# Patient Record
Sex: Female | Born: 1940 | Race: Black or African American | Hispanic: No | Smoking: Former smoker
Health system: Southern US, Community
[De-identification: ages and names within clinical notes are randomized; demographics above are authoritative.]

## PROBLEM LIST (undated history)

## (undated) DIAGNOSIS — I2510 Atherosclerotic heart disease of native coronary artery without angina pectoris: Secondary | ICD-10-CM

## (undated) DIAGNOSIS — R7303 Prediabetes: Secondary | ICD-10-CM

## (undated) DIAGNOSIS — I10 Essential (primary) hypertension: Secondary | ICD-10-CM

## (undated) DIAGNOSIS — E785 Hyperlipidemia, unspecified: Secondary | ICD-10-CM

## (undated) DIAGNOSIS — I429 Cardiomyopathy, unspecified: Secondary | ICD-10-CM

## (undated) HISTORY — DX: Prediabetes: R73.03

## (undated) HISTORY — DX: Essential (primary) hypertension: I10

## (undated) HISTORY — DX: Cardiomyopathy, unspecified: I42.9

## (undated) HISTORY — DX: Hyperlipidemia, unspecified: E78.5

## (undated) HISTORY — DX: Atherosclerotic heart disease of native coronary artery without angina pectoris: I25.10

---

## 2008-01-04 ENCOUNTER — Ambulatory Visit: Payer: Self-pay | Admitting: Internal Medicine

## 2008-01-04 DIAGNOSIS — R252 Cramp and spasm: Secondary | ICD-10-CM | POA: Insufficient documentation

## 2008-01-04 DIAGNOSIS — I10 Essential (primary) hypertension: Secondary | ICD-10-CM | POA: Insufficient documentation

## 2008-01-04 DIAGNOSIS — R002 Palpitations: Secondary | ICD-10-CM

## 2008-01-04 DIAGNOSIS — F411 Generalized anxiety disorder: Secondary | ICD-10-CM | POA: Insufficient documentation

## 2008-01-05 ENCOUNTER — Encounter (INDEPENDENT_AMBULATORY_CARE_PROVIDER_SITE_OTHER): Payer: Self-pay | Admitting: Internal Medicine

## 2008-01-11 ENCOUNTER — Ambulatory Visit (HOSPITAL_COMMUNITY): Admission: RE | Admit: 2008-01-11 | Discharge: 2008-01-12 | Payer: Self-pay | Admitting: Internal Medicine

## 2008-01-11 ENCOUNTER — Ambulatory Visit: Payer: Self-pay | Admitting: Cardiology

## 2008-01-11 ENCOUNTER — Encounter (INDEPENDENT_AMBULATORY_CARE_PROVIDER_SITE_OTHER): Payer: Self-pay | Admitting: Internal Medicine

## 2008-01-12 ENCOUNTER — Encounter (INDEPENDENT_AMBULATORY_CARE_PROVIDER_SITE_OTHER): Payer: Self-pay | Admitting: Internal Medicine

## 2008-01-15 ENCOUNTER — Ambulatory Visit: Payer: Self-pay | Admitting: Cardiology

## 2008-02-04 LAB — CONVERTED CEMR LAB
ALT: 23 units/L (ref 0–35)
Alkaline Phosphatase: 86 units/L (ref 39–117)
Basophils Absolute: 0 10*3/uL (ref 0.0–0.1)
Creatinine, Ser: 0.8 mg/dL (ref 0.40–1.20)
Eosinophils Absolute: 0.1 10*3/uL (ref 0.0–0.7)
Eosinophils Relative: 2 % (ref 0–5)
HCT: 44 % (ref 36.0–46.0)
LDL Cholesterol: 130 mg/dL — ABNORMAL HIGH (ref 0–99)
MCHC: 32.7 g/dL (ref 30.0–36.0)
MCV: 87 fL (ref 78.0–100.0)
Monocytes Absolute: 0.5 10*3/uL (ref 0.1–1.0)
Platelets: 293 10*3/uL (ref 150–400)
RDW: 12.9 % (ref 11.5–15.5)
Sodium: 142 meq/L (ref 135–145)
Total Bilirubin: 0.4 mg/dL (ref 0.3–1.2)
Total CHOL/HDL Ratio: 5.8
Total Protein: 7.9 g/dL (ref 6.0–8.3)
Triglycerides: 236 mg/dL — ABNORMAL HIGH (ref <150)
VLDL: 47 mg/dL — ABNORMAL HIGH (ref 0–40)

## 2008-02-10 ENCOUNTER — Ambulatory Visit: Payer: Self-pay | Admitting: Internal Medicine

## 2008-03-26 ENCOUNTER — Encounter (INDEPENDENT_AMBULATORY_CARE_PROVIDER_SITE_OTHER): Payer: Self-pay | Admitting: Internal Medicine

## 2008-07-13 ENCOUNTER — Ambulatory Visit: Payer: Self-pay | Admitting: Internal Medicine

## 2008-07-13 DIAGNOSIS — F17200 Nicotine dependence, unspecified, uncomplicated: Secondary | ICD-10-CM

## 2008-07-13 DIAGNOSIS — E785 Hyperlipidemia, unspecified: Secondary | ICD-10-CM

## 2008-07-13 LAB — CONVERTED CEMR LAB
Bilirubin Urine: NEGATIVE
Glucose, Urine, Semiquant: NEGATIVE
Protein, U semiquant: NEGATIVE
Urobilinogen, UA: 0.2
WBC Urine, dipstick: NEGATIVE

## 2008-07-14 ENCOUNTER — Encounter (INDEPENDENT_AMBULATORY_CARE_PROVIDER_SITE_OTHER): Payer: Self-pay | Admitting: Internal Medicine

## 2008-07-15 LAB — CONVERTED CEMR LAB
Alkaline Phosphatase: 77 units/L (ref 39–117)
CO2: 28 meq/L (ref 19–32)
Cholesterol: 185 mg/dL (ref 0–200)
Creatinine, Ser: 0.96 mg/dL (ref 0.40–1.20)
Glucose, Bld: 172 mg/dL — ABNORMAL HIGH (ref 70–99)
HDL: 43 mg/dL (ref >39)
LDL Cholesterol: 104 mg/dL — ABNORMAL HIGH (ref 0–99)
Sodium: 139 meq/L (ref 135–145)
Total Bilirubin: 0.4 mg/dL (ref 0.3–1.2)
Total CHOL/HDL Ratio: 4.3
Total Protein: 7.8 g/dL (ref 6.0–8.3)
Triglycerides: 188 mg/dL — ABNORMAL HIGH (ref <150)
VLDL: 38 mg/dL (ref 0–40)

## 2008-07-19 ENCOUNTER — Encounter (INDEPENDENT_AMBULATORY_CARE_PROVIDER_SITE_OTHER): Payer: Self-pay | Admitting: Internal Medicine

## 2008-08-10 ENCOUNTER — Ambulatory Visit: Payer: Self-pay | Admitting: Internal Medicine

## 2008-08-10 DIAGNOSIS — E119 Type 2 diabetes mellitus without complications: Secondary | ICD-10-CM | POA: Insufficient documentation

## 2008-08-25 ENCOUNTER — Encounter (INDEPENDENT_AMBULATORY_CARE_PROVIDER_SITE_OTHER): Payer: Self-pay | Admitting: Internal Medicine

## 2008-09-13 ENCOUNTER — Ambulatory Visit: Payer: Self-pay | Admitting: Internal Medicine

## 2008-10-07 ENCOUNTER — Ambulatory Visit: Payer: Self-pay | Admitting: Internal Medicine

## 2008-10-07 DIAGNOSIS — R109 Unspecified abdominal pain: Secondary | ICD-10-CM | POA: Insufficient documentation

## 2008-10-07 LAB — CONVERTED CEMR LAB
Bilirubin Urine: NEGATIVE
Blood in Urine, dipstick: NEGATIVE
Nitrite: NEGATIVE
Protein, U semiquant: NEGATIVE
Specific Gravity, Urine: 1.015
WBC Urine, dipstick: NEGATIVE

## 2010-02-19 ENCOUNTER — Encounter: Payer: Self-pay | Admitting: Family Medicine

## 2012-04-15 ENCOUNTER — Other Ambulatory Visit (HOSPITAL_COMMUNITY): Payer: Self-pay | Admitting: Family Medicine

## 2012-04-20 ENCOUNTER — Ambulatory Visit (HOSPITAL_COMMUNITY)
Admission: RE | Admit: 2012-04-20 | Discharge: 2012-04-20 | Disposition: A | Discharge status: Home / Self Care | Payer: PRIVATE HEALTH INSURANCE | Source: Ambulatory Visit | Attending: Family Medicine | Admitting: Family Medicine

## 2012-04-20 DIAGNOSIS — Z1231 Encounter for screening mammogram for malignant neoplasm of breast: Secondary | ICD-10-CM | POA: Insufficient documentation

## 2012-04-20 DIAGNOSIS — Z139 Encounter for screening, unspecified: Secondary | ICD-10-CM

## 2015-05-10 ENCOUNTER — Encounter: Payer: Self-pay | Admitting: Podiatry

## 2015-05-10 ENCOUNTER — Ambulatory Visit (INDEPENDENT_AMBULATORY_CARE_PROVIDER_SITE_OTHER): Payer: Medicare Other

## 2015-05-10 ENCOUNTER — Ambulatory Visit (INDEPENDENT_AMBULATORY_CARE_PROVIDER_SITE_OTHER): Payer: Medicare Other | Admitting: Podiatry

## 2015-05-10 ENCOUNTER — Ambulatory Visit: Payer: Self-pay

## 2015-05-10 VITALS — BP 167/99 | HR 80 | Resp 16 | Ht 67.0 in | Wt 162.0 lb

## 2015-05-10 DIAGNOSIS — M779 Enthesopathy, unspecified: Secondary | ICD-10-CM | POA: Diagnosis not present

## 2015-05-10 DIAGNOSIS — M21619 Bunion of unspecified foot: Secondary | ICD-10-CM

## 2015-05-10 DIAGNOSIS — M79672 Pain in left foot: Secondary | ICD-10-CM | POA: Diagnosis not present

## 2015-05-10 DIAGNOSIS — M79671 Pain in right foot: Secondary | ICD-10-CM

## 2015-05-10 DIAGNOSIS — M216X9 Other acquired deformities of unspecified foot: Secondary | ICD-10-CM

## 2015-05-10 MED ORDER — TRIAMCINOLONE ACETONIDE 10 MG/ML IJ SUSP
10.0000 mg | Freq: Once | INTRAMUSCULAR | Status: AC
  Administered 2015-05-10: 10 mg

## 2015-05-10 NOTE — Progress Notes (Signed)
   Subjective:    Patient ID: Taylor Long, female    DOB: 12/09/1940, 75 y.o.   MRN: [id number]  HPI Patient presents with foot pain in their Left foot; dorsal near ankle; pt stated, "When lift up foot, hears and feels popping sound"; x2 yrs  Patient also presents with bilateral callouses; plantar forefoot-below 2nd toe; Corn-Left-5th toe-lateral   Review of Systems  Cardiovascular: Positive for palpitations.  All other systems reviewed and are negative.      Objective:   Physical Exam        Assessment & Plan:

## 2015-05-10 NOTE — Progress Notes (Signed)
Subjective:     Patient ID: Taylor Long, female   DOB: 04/02/1940, 75 y.o.   MRN: [id number]  HPI patient presents with structural bunion deformity left that she states doesn't hurt a feeling of a popping sensation in her left foot that does not hurt and pain in the outside of the left foot. Also complains of pain underneath the right foot with corn formation that she tries to trim herself   Review of Systems  All other systems reviewed and are negative.      Objective:   Physical Exam  Constitutional: She is oriented to person, place, and time.  Cardiovascular: Intact distal pulses.   Musculoskeletal: Normal range of motion.  Neurological: She is oriented to person, place, and time.  Skin: Skin is warm and dry.  Nursing note and vitals reviewed.  neurovascular status intact muscle strength was adequate range of motion was diminished but within normal limits. Large structural bunion deformity left nonsymptomatic with quite a bit of pain in the outside the left foot around the peroneal tendon. Patient is noted to have significant keratotic lesion second digit right metatarsal to that is painful when pressed and does not have any muscle loss of the peroneal tendon currently good digital perfusion is noted     Assessment:     Probable plantarflexed second metatarsal with keratotic lesion chronic in nature along with tendinitis left peroneal tendon with possibility of sheath tear even though no dysfunction noted with structural bunion deformity left nonsymptomatic    Plan:     H&P and all conditions reviewed with patient at great length. Patient was not happy with my care despite my best efforts and I did debride the lesion right and applied padding and did careful sheath injection left which she states made her feet feel better. I explained at great length to her that I do think at one point osteotomy of the right may be necessary and possible MRI left and also I do not recommend  structural bunion deformity since it does not hurt. She seemed unhappy with my care and I did offer her other physicians to see and she will make a decision and call us back. I encouraged her to call with any questions  Indicated structural bunion deformity left no indications of advanced arthritis or fracture

## 2015-06-30 ENCOUNTER — Other Ambulatory Visit (HOSPITAL_COMMUNITY): Payer: Self-pay | Admitting: Family Medicine

## 2015-06-30 DIAGNOSIS — Z1231 Encounter for screening mammogram for malignant neoplasm of breast: Secondary | ICD-10-CM

## 2015-07-06 ENCOUNTER — Ambulatory Visit (HOSPITAL_COMMUNITY)
Admission: RE | Admit: 2015-07-06 | Discharge: 2015-07-06 | Disposition: A | Discharge status: Home / Self Care | Payer: Medicare Other | Source: Ambulatory Visit | Attending: Family Medicine | Admitting: Family Medicine

## 2015-07-06 DIAGNOSIS — Z1231 Encounter for screening mammogram for malignant neoplasm of breast: Secondary | ICD-10-CM | POA: Insufficient documentation

## 2016-03-18 ENCOUNTER — Emergency Department (HOSPITAL_COMMUNITY)
Admission: EM | Admit: 2016-03-18 | Discharge: 2016-03-18 | Disposition: A | Discharge status: Home / Self Care | Payer: Medicare Other | Attending: Emergency Medicine | Admitting: Emergency Medicine

## 2016-03-18 ENCOUNTER — Encounter (HOSPITAL_COMMUNITY): Payer: Self-pay | Admitting: *Deleted

## 2016-03-18 ENCOUNTER — Emergency Department (HOSPITAL_COMMUNITY): Payer: Medicare Other

## 2016-03-18 DIAGNOSIS — Z79899 Other long term (current) drug therapy: Secondary | ICD-10-CM | POA: Diagnosis not present

## 2016-03-18 DIAGNOSIS — I160 Hypertensive urgency: Secondary | ICD-10-CM | POA: Diagnosis not present

## 2016-03-18 DIAGNOSIS — E876 Hypokalemia: Secondary | ICD-10-CM | POA: Insufficient documentation

## 2016-03-18 DIAGNOSIS — E119 Type 2 diabetes mellitus without complications: Secondary | ICD-10-CM | POA: Diagnosis not present

## 2016-03-18 DIAGNOSIS — Z87891 Personal history of nicotine dependence: Secondary | ICD-10-CM | POA: Insufficient documentation

## 2016-03-18 DIAGNOSIS — Z791 Long term (current) use of non-steroidal anti-inflammatories (NSAID): Secondary | ICD-10-CM | POA: Insufficient documentation

## 2016-03-18 DIAGNOSIS — I10 Essential (primary) hypertension: Secondary | ICD-10-CM

## 2016-03-18 DIAGNOSIS — R51 Headache: Secondary | ICD-10-CM | POA: Diagnosis present

## 2016-03-18 LAB — COMPREHENSIVE METABOLIC PANEL
ALT: 21 U/L (ref 14–54)
AST: 30 U/L (ref 15–41)
Albumin: 4.8 g/dL (ref 3.5–5.0)
Alkaline Phosphatase: 89 U/L (ref 38–126)
Anion gap: 13 (ref 5–15)
BUN: 22 mg/dL — ABNORMAL HIGH (ref 6–20)
CO2: 35 mmol/L — ABNORMAL HIGH (ref 22–32)
Calcium: 10.1 mg/dL (ref 8.9–10.3)
Chloride: 86 mmol/L — ABNORMAL LOW (ref 101–111)
Creatinine, Ser: 1.19 mg/dL — ABNORMAL HIGH (ref 0.44–1.00)
GFR calc Af Amer: 51 mL/min — ABNORMAL LOW (ref >60)
GFR calc non Af Amer: 44 mL/min — ABNORMAL LOW (ref >60)
Glucose, Bld: 183 mg/dL — ABNORMAL HIGH (ref 65–99)
Potassium: 2.2 mmol/L — CL (ref 3.5–5.1)
Sodium: 134 mmol/L — ABNORMAL LOW (ref 135–145)
Total Bilirubin: 1 mg/dL (ref 0.3–1.2)
Total Protein: 9.1 g/dL — ABNORMAL HIGH (ref 6.5–8.1)

## 2016-03-18 LAB — CBC WITH DIFFERENTIAL/PLATELET
Basophils Absolute: 0 10*3/uL (ref 0.0–0.1)
Basophils Relative: 0 %
Eosinophils Absolute: 0 10*3/uL (ref 0.0–0.7)
Eosinophils Relative: 0 %
HCT: 42.2 % (ref 36.0–46.0)
Hemoglobin: 14.1 g/dL (ref 12.0–15.0)
Lymphocytes Relative: 10 %
Lymphs Abs: 1.1 10*3/uL (ref 0.7–4.0)
MCH: 26.9 pg (ref 26.0–34.0)
MCHC: 33.4 g/dL (ref 30.0–36.0)
MCV: 80.5 fL (ref 78.0–100.0)
Monocytes Absolute: 0.6 10*3/uL (ref 0.1–1.0)
Monocytes Relative: 5 %
Neutro Abs: 9.7 10*3/uL — ABNORMAL HIGH (ref 1.7–7.7)
Neutrophils Relative %: 85 %
Platelets: 290 10*3/uL (ref 150–400)
RBC: 5.24 MIL/uL — ABNORMAL HIGH (ref 3.87–5.11)
RDW: 12.6 % (ref 11.5–15.5)
WBC: 11.4 10*3/uL — ABNORMAL HIGH (ref 4.0–10.5)

## 2016-03-18 LAB — TROPONIN I
Troponin I: 0.05 ng/mL (ref <0.03)
Troponin I: 0.05 ng/mL (ref <0.03)

## 2016-03-18 LAB — BRAIN NATRIURETIC PEPTIDE: B Natriuretic Peptide: 307 pg/mL — ABNORMAL HIGH (ref 0.0–100.0)

## 2016-03-18 LAB — MAGNESIUM: Magnesium: 2.3 mg/dL (ref 1.7–2.4)

## 2016-03-18 MED ORDER — DIPHENHYDRAMINE HCL 50 MG/ML IJ SOLN
25.0000 mg | Freq: Once | INTRAMUSCULAR | Status: AC
  Administered 2016-03-18: 25 mg via INTRAVENOUS
  Filled 2016-03-18: qty 1

## 2016-03-18 MED ORDER — POTASSIUM CHLORIDE 10 MEQ/100ML IV SOLN
10.0000 meq | INTRAVENOUS | Status: AC
Start: 2016-03-18 — End: 2016-03-18
  Administered 2016-03-18 (×2): 10 meq via INTRAVENOUS
  Filled 2016-03-18 (×2): qty 100

## 2016-03-18 MED ORDER — LABETALOL HCL 5 MG/ML IV SOLN
20.0000 mg | Freq: Once | INTRAVENOUS | Status: AC
  Administered 2016-03-18: 20 mg via INTRAVENOUS
  Filled 2016-03-18: qty 4

## 2016-03-18 MED ORDER — POTASSIUM CHLORIDE 10 MEQ/100ML IV SOLN
10.0000 meq | Freq: Once | INTRAVENOUS | Status: AC
  Administered 2016-03-18: 10 meq via INTRAVENOUS
  Filled 2016-03-18: qty 100

## 2016-03-18 MED ORDER — ONDANSETRON HCL 4 MG/2ML IJ SOLN
4.0000 mg | Freq: Once | INTRAMUSCULAR | Status: AC
  Administered 2016-03-18: 4 mg via INTRAVENOUS
  Filled 2016-03-18: qty 2

## 2016-03-18 MED ORDER — ONDANSETRON HCL 4 MG PO TABS
4.0000 mg | ORAL_TABLET | Freq: Three times a day (TID) | ORAL | 0 refills | Status: DC | PRN
Start: 2016-03-18 — End: 2016-04-24

## 2016-03-18 MED ORDER — POTASSIUM CHLORIDE CRYS ER 20 MEQ PO TBCR
20.0000 meq | EXTENDED_RELEASE_TABLET | Freq: Once | ORAL | Status: AC
Start: 2016-03-18 — End: 2016-03-18
  Administered 2016-03-18: 20 meq via ORAL
  Filled 2016-03-18: qty 1

## 2016-03-18 MED ORDER — POTASSIUM CHLORIDE CRYS ER 20 MEQ PO TBCR
60.0000 meq | EXTENDED_RELEASE_TABLET | Freq: Once | ORAL | Status: AC
  Administered 2016-03-18: 60 meq via ORAL
  Filled 2016-03-18: qty 3

## 2016-03-18 MED ORDER — METOPROLOL TARTRATE 50 MG PO TABS: 50.0000 mg | ORAL_TABLET | Freq: Two times a day (BID) | ORAL | 0 refills | Status: DC

## 2016-03-18 MED ORDER — SODIUM CHLORIDE 0.9 % IV SOLN
INTRAVENOUS | Status: DC
  Administered 2016-03-18: 12:00:00 via INTRAVENOUS

## 2016-03-18 MED ORDER — PROCHLORPERAZINE EDISYLATE 5 MG/ML IJ SOLN
10.0000 mg | Freq: Once | INTRAMUSCULAR | Status: AC
  Administered 2016-03-18: 10 mg via INTRAVENOUS
  Filled 2016-03-18: qty 2

## 2016-03-18 NOTE — ED Triage Notes (Signed)
Pt says PCP (Dr. Sudie BaileyKnowlton) added 25 mg Metoprolol to her HTN medication Hydrochlorothiazide 25mg . Pt says her BP has been elevated ever since.

## 2016-03-18 NOTE — ED Notes (Signed)
Patient states nausea has gone. Feels much better but sleepy. BP 165/105

## 2016-03-18 NOTE — ED Notes (Signed)
Patient reports nausea subsided and she feels sleepy but much better. BP still 200 systolic. Dr. Juleen ChinaKohut aware. Med orders given,.

## 2016-03-18 NOTE — ED Provider Notes (Signed)
[facility]EM[facility]GENCY DEPT Provider Note   CSN: [id number] Arrival date & time: 03/18/16  [id number]   By signing my name below, I, Taylor Long, attest that this documentation has been prepared under the direction and in the presence of Raeford RazorStephen Karl Knarr, MD . Electronically Signed: Freida Busmaniana Long, Scribe. 03/18/2016. 11:19 AM.  History   Chief Complaint Chief Complaint  Patient presents with  . Hypertension    The history is provided by the patient. No language interpreter was used.     HPI Comments:  Taylor Long is a 76 y.o. female with a history of HTN, who presents to the Emergency Department complaining of daily headaches x a few weeks. She reports pain to the left sided of her head. Pt reports associated nausea and vomiting. She notes recent change in BP meds;  PCP added 25 mg Metoprolol to her Hydrochlorothiazide 25mg  on 2/92/2018. Her blood pressures at home have been in the 200s systolically. She denies numbness/tingling/weakness, leg swelling, urinary symptoms/ changes in urination. No alleviating factors noted.   Past Medical History:  Diagnosis Date  . Hypertension     Patient Active Problem List   Diagnosis Date Noted  . PELVIC  PAIN 10/07/2008  . DIABETES MELLITUS, TYPE II 08/10/2008  . HYPERLIPIDEMIA 07/13/2008  . TOBACCO ABUSE 07/13/2008  . ANXIETY 01/04/2008  . HYPERTENSION 01/04/2008  . LEG CRAMPS 01/04/2008  . PALPITATIONS 01/04/2008    History reviewed. No pertinent surgical history.  OB History    Gravida Para Term Preterm AB Living             4   SAB TAB Ectopic Multiple Live Births                   Home Medications    Prior to Admission medications   Medication Sig Start Date End Date Taking? Authorizing Provider  hydrochlorothiazide (HYDRODIURIL) 25 MG tablet  05/01/15   Historical Provider, MD    Family History No family history on file.  Social History Social History  Substance Use Topics  . Smoking status: Former Games developermoker  . Smokeless  tobacco: Never Used  . Alcohol use No     Allergies   Latex   Review of Systems Review of Systems  Cardiovascular: Negative for leg swelling.  Gastrointestinal: Positive for nausea and vomiting.  Genitourinary: Negative for dysuria, frequency and hematuria.  Neurological: Positive for headaches. Negative for weakness and numbness.  All other systems reviewed and are negative.    Physical Exam Updated Vital Signs BP (!) 237/127   Pulse 86   Temp 97.9 F (36.6 C) (Oral)   Resp 22   Ht 5\' 6"  (1.676 m)   Wt 153 lb (69.4 kg)   SpO2 97%   BMI 24.69 kg/m   Physical Exam  Constitutional: She is oriented to person, place, and time. She appears well-developed and well-nourished. No distress.  HENT:  Head: Normocephalic and atraumatic.  Eyes: EOM are normal.  Neck: Normal range of motion.  Cardiovascular: Normal rate, regular rhythm and normal heart sounds.   Pulmonary/Chest: Effort normal and breath sounds normal.  Abdominal: Soft. She exhibits no distension. There is no tenderness.  Musculoskeletal: Normal range of motion.  Neurological: She is alert and oriented to person, place, and time.  Skin: Skin is warm and dry.  Psychiatric: She has a normal mood and affect.  Nursing note and vitals reviewed.    ED Treatments / Results  DIAGNOSTIC STUDIES:  Oxygen Saturation is 97% on RA,  normal by my interpretation.    COORDINATION OF CARE:  10:51 AM Discussed treatment plan with pt at bedside and pt agreed to plan.  Labs (all labs ordered are listed, but only abnormal results are displayed) Labs Reviewed  CBC WITH DIFFERENTIAL/PLATELET  COMPREHENSIVE METABOLIC PANEL  TROPONIN I    EKG  EKG Interpretation None       Radiology No results found.  Procedures Procedures (including critical care time)  Medications Ordered in ED Medications - No data to display   Initial Impression / Assessment and Plan / ED Course  I have reviewed the triage vital signs  and the nursing notes.  Pertinent labs & imaging results that were available during my care of the patient were reviewed by me and considered in my medical decision making (see chart for details).      Final Clinical Impressions(s) / ED Diagnoses   Final diagnoses:  Essential hypertension  Hypokalemia  Hypertensive urgency    New Prescriptions New Prescriptions   No medications on file   I personally preformed the services scribed in my presence. The recorded information has been reviewed is accurate. Raeford Razor, MD.     Raeford Razor, MD 03/24/16 2113

## 2016-04-05 ENCOUNTER — Inpatient Hospital Stay (HOSPITAL_COMMUNITY)
Admission: EM | Admit: 2016-04-05 | Discharge: 2016-04-19 | DRG: 269 | Disposition: A | Discharge status: Home Health | Payer: Medicare Other | Attending: Vascular Surgery | Admitting: Vascular Surgery

## 2016-04-05 ENCOUNTER — Emergency Department (HOSPITAL_COMMUNITY): Payer: Medicare Other

## 2016-04-05 ENCOUNTER — Other Ambulatory Visit (HOSPITAL_COMMUNITY): Payer: Self-pay | Admitting: Family Medicine

## 2016-04-05 ENCOUNTER — Encounter (HOSPITAL_COMMUNITY): Payer: Self-pay | Admitting: Emergency Medicine

## 2016-04-05 ENCOUNTER — Ambulatory Visit (HOSPITAL_COMMUNITY)
Admission: RE | Admit: 2016-04-05 | Discharge: 2016-04-05 | Disposition: A | Discharge status: Home / Self Care | Payer: Medicare Other | Source: Ambulatory Visit | Attending: Family Medicine | Admitting: Family Medicine

## 2016-04-05 DIAGNOSIS — I714 Abdominal aortic aneurysm, without rupture, unspecified: Secondary | ICD-10-CM

## 2016-04-05 DIAGNOSIS — Z01810 Encounter for preprocedural cardiovascular examination: Secondary | ICD-10-CM | POA: Diagnosis not present

## 2016-04-05 DIAGNOSIS — Z79899 Other long term (current) drug therapy: Secondary | ICD-10-CM

## 2016-04-05 DIAGNOSIS — I130 Hypertensive heart and chronic kidney disease with heart failure and stage 1 through stage 4 chronic kidney disease, or unspecified chronic kidney disease: Secondary | ICD-10-CM | POA: Diagnosis present

## 2016-04-05 DIAGNOSIS — N280 Ischemia and infarction of kidney: Secondary | ICD-10-CM

## 2016-04-05 DIAGNOSIS — R0602 Shortness of breath: Secondary | ICD-10-CM

## 2016-04-05 DIAGNOSIS — K660 Peritoneal adhesions (postprocedural) (postinfection): Secondary | ICD-10-CM | POA: Diagnosis not present

## 2016-04-05 DIAGNOSIS — R197 Diarrhea, unspecified: Secondary | ICD-10-CM | POA: Diagnosis not present

## 2016-04-05 DIAGNOSIS — F419 Anxiety disorder, unspecified: Secondary | ICD-10-CM | POA: Diagnosis present

## 2016-04-05 DIAGNOSIS — R002 Palpitations: Secondary | ICD-10-CM | POA: Diagnosis not present

## 2016-04-05 DIAGNOSIS — I2584 Coronary atherosclerosis due to calcified coronary lesion: Secondary | ICD-10-CM | POA: Diagnosis present

## 2016-04-05 DIAGNOSIS — E876 Hypokalemia: Secondary | ICD-10-CM | POA: Diagnosis not present

## 2016-04-05 DIAGNOSIS — R1900 Intra-abdominal and pelvic swelling, mass and lump, unspecified site: Secondary | ICD-10-CM | POA: Diagnosis present

## 2016-04-05 DIAGNOSIS — I2510 Atherosclerotic heart disease of native coronary artery without angina pectoris: Secondary | ICD-10-CM | POA: Diagnosis not present

## 2016-04-05 DIAGNOSIS — R1010 Upper abdominal pain, unspecified: Secondary | ICD-10-CM

## 2016-04-05 DIAGNOSIS — N183 Chronic kidney disease, stage 3 unspecified: Secondary | ICD-10-CM

## 2016-04-05 DIAGNOSIS — I25119 Atherosclerotic heart disease of native coronary artery with unspecified angina pectoris: Secondary | ICD-10-CM | POA: Diagnosis present

## 2016-04-05 DIAGNOSIS — Z8679 Personal history of other diseases of the circulatory system: Secondary | ICD-10-CM | POA: Insufficient documentation

## 2016-04-05 DIAGNOSIS — I25118 Atherosclerotic heart disease of native coronary artery with other forms of angina pectoris: Secondary | ICD-10-CM | POA: Diagnosis not present

## 2016-04-05 DIAGNOSIS — Z9889 Other specified postprocedural states: Secondary | ICD-10-CM

## 2016-04-05 DIAGNOSIS — I208 Other forms of angina pectoris: Secondary | ICD-10-CM | POA: Diagnosis not present

## 2016-04-05 DIAGNOSIS — K8020 Calculus of gallbladder without cholecystitis without obstruction: Secondary | ICD-10-CM

## 2016-04-05 DIAGNOSIS — I161 Hypertensive emergency: Secondary | ICD-10-CM | POA: Diagnosis not present

## 2016-04-05 DIAGNOSIS — E1122 Type 2 diabetes mellitus with diabetic chronic kidney disease: Secondary | ICD-10-CM | POA: Diagnosis present

## 2016-04-05 DIAGNOSIS — I509 Heart failure, unspecified: Secondary | ICD-10-CM | POA: Diagnosis present

## 2016-04-05 DIAGNOSIS — Z87891 Personal history of nicotine dependence: Secondary | ICD-10-CM

## 2016-04-05 DIAGNOSIS — I959 Hypotension, unspecified: Secondary | ICD-10-CM | POA: Diagnosis present

## 2016-04-05 DIAGNOSIS — E785 Hyperlipidemia, unspecified: Secondary | ICD-10-CM | POA: Diagnosis present

## 2016-04-05 DIAGNOSIS — I110 Hypertensive heart disease with heart failure: Secondary | ICD-10-CM | POA: Diagnosis not present

## 2016-04-05 DIAGNOSIS — I10 Essential (primary) hypertension: Secondary | ICD-10-CM | POA: Diagnosis not present

## 2016-04-05 LAB — CBC WITH DIFFERENTIAL/PLATELET
BASOS ABS: 0 10*3/uL (ref 0.0–0.1)
BASOS PCT: 0 %
EOS ABS: 0.1 10*3/uL (ref 0.0–0.7)
EOS PCT: 1 %
HCT: 40.9 % (ref 36.0–46.0)
Hemoglobin: 13.7 g/dL (ref 12.0–15.0)
Lymphocytes Relative: 29 %
Lymphs Abs: 2.5 10*3/uL (ref 0.7–4.0)
MCH: 27.5 pg (ref 26.0–34.0)
MCHC: 33.5 g/dL (ref 30.0–36.0)
MCV: 82 fL (ref 78.0–100.0)
MONO ABS: 0.6 10*3/uL (ref 0.1–1.0)
Monocytes Relative: 7 %
Neutro Abs: 5.2 10*3/uL (ref 1.7–7.7)
Neutrophils Relative %: 63 %
PLATELETS: 343 10*3/uL (ref 150–400)
RBC: 4.99 MIL/uL (ref 3.87–5.11)
RDW: 13.1 % (ref 11.5–15.5)
WBC: 8.4 10*3/uL (ref 4.0–10.5)

## 2016-04-05 LAB — COMPREHENSIVE METABOLIC PANEL
ALT: 17 U/L (ref 14–54)
AST: 22 U/L (ref 15–41)
Albumin: 4.5 g/dL (ref 3.5–5.0)
Alkaline Phosphatase: 79 U/L (ref 38–126)
Anion gap: 10 (ref 5–15)
BILIRUBIN TOTAL: 0.6 mg/dL (ref 0.3–1.2)
BUN: 18 mg/dL (ref 6–20)
CALCIUM: 10.1 mg/dL (ref 8.9–10.3)
CO2: 32 mmol/L (ref 22–32)
CREATININE: 0.97 mg/dL (ref 0.44–1.00)
Chloride: 94 mmol/L — ABNORMAL LOW (ref 101–111)
GFR, EST NON AFRICAN AMERICAN: 57 mL/min — AB (ref >60)
Glucose, Bld: 108 mg/dL — ABNORMAL HIGH (ref 65–99)
Potassium: 3.1 mmol/L — ABNORMAL LOW (ref 3.5–5.1)
Sodium: 136 mmol/L (ref 135–145)
TOTAL PROTEIN: 8.8 g/dL — AB (ref 6.5–8.1)

## 2016-04-05 LAB — LIPASE, BLOOD: Lipase: 18 U/L (ref 11–51)

## 2016-04-05 LAB — SAMPLE TO BLOOD BANK

## 2016-04-05 MED ORDER — LOSARTAN POTASSIUM 50 MG PO TABS
50.0000 mg | ORAL_TABLET | Freq: Every day | ORAL | Status: DC
  Administered 2016-04-06 – 2016-04-08 (×3): 50 mg via ORAL
  Filled 2016-04-05 (×3): qty 1

## 2016-04-05 MED ORDER — IBUPROFEN 200 MG PO TABS
200.0000 mg | ORAL_TABLET | Freq: Four times a day (QID) | ORAL | Status: DC | PRN
  Administered 2016-04-06: 200 mg via ORAL
  Filled 2016-04-05: qty 1

## 2016-04-05 MED ORDER — METOPROLOL SUCCINATE ER 25 MG PO TB24
25.0000 mg | ORAL_TABLET | Freq: Every day | ORAL | Status: DC
  Administered 2016-04-06: 25 mg via ORAL
  Filled 2016-04-05: qty 1

## 2016-04-05 MED ORDER — HYDROCHLOROTHIAZIDE 25 MG PO TABS
25.0000 mg | ORAL_TABLET | Freq: Every day | ORAL | Status: DC
  Administered 2016-04-06: 25 mg via ORAL
  Filled 2016-04-05: qty 1

## 2016-04-05 MED ORDER — LABETALOL HCL 5 MG/ML IV SOLN
20.0000 mg | Freq: Once | INTRAVENOUS | Status: AC
  Administered 2016-04-05: 20 mg via INTRAVENOUS
  Filled 2016-04-05: qty 4

## 2016-04-05 MED ORDER — ONDANSETRON HCL 4 MG PO TABS: 4.0000 mg | ORAL_TABLET | Freq: Three times a day (TID) | ORAL | Status: DC | PRN

## 2016-04-05 MED ORDER — SODIUM CHLORIDE 0.9 % IV SOLN
INTRAVENOUS | Status: DC
  Administered 2016-04-05 – 2016-04-11 (×3): via INTRAVENOUS

## 2016-04-05 MED ORDER — LORAZEPAM 0.5 MG PO TABS
0.2500 mg | ORAL_TABLET | Freq: Two times a day (BID) | ORAL | Status: DC | PRN
  Administered 2016-04-06: 0.25 mg via ORAL
  Administered 2016-04-07: 0.5 mg via ORAL
  Filled 2016-04-05 (×2): qty 1

## 2016-04-05 MED ORDER — LABETALOL HCL 5 MG/ML IV SOLN
10.0000 mg | INTRAVENOUS | Status: DC | PRN
  Administered 2016-04-05 – 2016-04-06 (×3): 10 mg via INTRAVENOUS
  Filled 2016-04-05 (×3): qty 4

## 2016-04-05 MED ORDER — METOPROLOL SUCCINATE ER 50 MG PO TB24: 50.0000 mg | ORAL_TABLET | Freq: Every day | ORAL | Status: DC

## 2016-04-05 MED ORDER — IOPAMIDOL (ISOVUE-370) INJECTION 76%
100.0000 mL | Freq: Once | INTRAVENOUS | Status: AC | PRN
  Administered 2016-04-05: 100 mL via INTRAVENOUS

## 2016-04-05 NOTE — ED Provider Notes (Signed)
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 Evaluated on arrival, VSS, AAOx3. Consult to Vascular Surgery, will admit for elective repair.   Discussed with my attending physician, Dr Clydene PughKnott.    Pablo LedgerElizabeth Mitchell Eaton Folmar, MD 04/06/16 0009    Lyndal Pulleyaniel Knott, MD 04/06/16 [phone]

## 2016-04-05 NOTE — H&P (Signed)
Patient name: Taylor Long MRN: [id number] DOB: 12/13/1940 Sex: female  REASON FOR ADMISSION: 8 cm abdominal aortic aneurysm  HPI: Taylor Long is a 76 y.o. female, who tells me that she was seen by her primary care physician today for a routine follow up visit and was found to have a pulsatile abdominal mass. She was sent to the emergency department and CT scan showed an 8 cm aneurysm. She was transferred here for vascular evaluation.  She denies abdominal pain or back pain. She does not describe any abdominal pain or back pain over the last year. There is no family history of aneurysmal disease in the family.  Her only significant medical history is hypertension which appears to be poorly controlled. She denies any history of diabetes, hypercholesterolemia, history of previous myocardial infarction, history of congestive heart failure or history of COPD.  Past Medical History:  Diagnosis Date  . Hypertension     History reviewed. No pertinent family history. There is no family history of premature cardiovascular disease or aneurysmal disease.  SOCIAL HISTORY: She quit smoking in December 2017. She smoked less than 1 pack per day. Social History   Social History  . Marital status: Widowed    Spouse name: N/A  . Number of children: N/A  . Years of education: N/A   Occupational History  . Not on file.   Social History Main Topics  . Smoking status: Former Games developermoker  . Smokeless tobacco: Never Used  . Alcohol use No  . Drug use: No  . Sexual activity: Not on file   Other Topics Concern  . Not on file   Social History Narrative  . No narrative on file    Allergies  Allergen Reactions  . Latex Other (See Comments)    Break out in a rash    Current Facility-Administered Medications  Medication Dose Route Frequency Provider Last Rate Last Dose  . 0.9 %  sodium chloride infusion   Intravenous Continuous Samuel JesterKathleen McManus, DO 75 mL/hr at 04/05/16 1840     Current  Outpatient Prescriptions  Medication Sig Dispense Refill  . hydrochlorothiazide (HYDRODIURIL) 25 MG tablet Take 25 mg by mouth daily.     Marland Kitchen. ibuprofen (ADVIL,MOTRIN) 200 MG tablet Take 200 mg by mouth every 6 (six) hours as needed for moderate pain.    Marland Kitchen. LORazepam (ATIVAN) 0.5 MG tablet Take 0.25-0.5 mg by mouth 2 (two) times daily as needed for anxiety.    Marland Kitchen. losartan (COZAAR) 50 MG tablet Take 1 tablet by mouth daily.    . metoprolol succinate (TOPROL-XL) 25 MG 24 hr tablet Take 1 tablet by mouth daily.    . metoprolol (LOPRESSOR) 50 MG tablet Take 1 tablet (50 mg total) by mouth 2 (two) times daily. Take 25 mg (1/2 tablet) in the morning and 50mg  in the evening. (Patient not taking: Reported on 04/05/2016) 60 tablet 0  . metoprolol succinate (TOPROL-XL) 50 MG 24 hr tablet Take 50 mg by mouth daily.    . ondansetron (ZOFRAN) 4 MG tablet Take 1 tablet (4 mg total) by mouth every 8 (eight) hours as needed for nausea or vomiting. 12 tablet 0    REVIEW OF SYSTEMS:  [X]  denotes positive finding, [ ]  denotes negative finding Cardiac  Comments:  Chest pain or chest pressure:    Shortness of breath upon exertion:    Short of breath when lying flat:    Irregular heart rhythm:        Vascular  Pain in calf, thigh, or hip brought on by ambulation:    Pain in feet at night that wakes you up from your sleep:     Blood clot in your veins:    Leg swelling:         Pulmonary    Oxygen at home:    Productive cough:     Wheezing:         Neurologic    Sudden weakness in arms or legs:     Sudden numbness in arms or legs:     Sudden onset of difficulty speaking or slurred speech:    Temporary loss of vision in one eye:     Problems with dizziness:         Gastrointestinal    Blood in stool:     Vomited blood:         Genitourinary    Burning when urinating:     Blood in urine:        Psychiatric    Major depression:         Hematologic    Bleeding problems:    Problems with blood  clotting too easily:        Skin    Rashes or ulcers:        Constitutional    Fever or chills:      PHYSICAL EXAM: Vitals:   04/05/16 1837 04/05/16 1849 04/05/16 1900 04/05/16 1930  BP: (!) 215/114 (!) 201/96 169/97 166/95  Pulse: 77 69 71 73  Resp: 20 20 21 15   Temp:      TempSrc:      SpO2: 100% 98% 98% 94%  Weight:      Height:        GENERAL: The patient is a well-nourished female, in no acute distress. The vital signs are documented above. CARDIAC: There is a regular rate and rhythm.  VASCULAR: I do not detect carotid bruits. She has palpable femoral pulses and palpable dorsalis pedis pulses bilaterally. She has no significant lower extremity swelling. PULMONARY: There is good air exchange bilaterally without wheezing or rales. ABDOMEN: Soft and non-tender with normal pitched bowel sounher aneurysm is easily palpable and is nontender. MUSCULOSKELETAL: There are no major deformities or cyanosis. NEUROLOGIC: No focal weakness or paresthesias are detected. SKIN: There are no ulcers or rashes noted. PSYCHIATRIC: The patient has a normal affect.  DATA:   CT ANGIOGRAM CHEST AND ABDOMEN: I have reviewed the images of her CT angiogram. She has an 8 cm aneurysm. There is no evidence of leak or retroperitoneal hematoma. The aneurysm ends above the bifurcation. The neck of the aneurysm is short and diseased and therefore I do not think she is a good candidate for endovascular repair of her aneurysm.  MEDICAL ISSUES:  8 CM ABDOMINAL AORTIC ANEURYSM: This patient has an 8 cm abdominal aortic aneurysm. This is asymptomatic. However, given the size I have recommended elective repair. Given that the neck is diseased and short in addition to being angulated I do not think she is a good candidate for an endovascular repair. This reason I recommended open repair. She will need preoperative cardiac clearance. I will also consult medicine to help control her blood pressure which is currently  not well-controlled. I could tentatively do her surgery Tuesday if she is cleared medically.   We have discussed the indications for aneurysm repair. I have explained that the risk of rupture without repair is approximately 5-10% per year. I have discussed the  potential complications of surgery, including but not limited to bleeding, renal failure, MI, wound healing problems, hernia, graft infection, embolization, or other unpredictable medical problems. I have explained that the risk of mortality or major morbidity is approximately 4-5%.   Waverly Ferrari [facility] [phone]

## 2016-04-05 NOTE — ED Triage Notes (Signed)
Pt seen at pcp today for general weakness. Was sent to us after pain to abd upon palpation.  [place] revealed large abd aneurysm.  Pt denies any pain unless abd is pressed on.

## 2016-04-05 NOTE — ED Provider Notes (Signed)
[facility]Y DEPT Provider Note   CSN: [id number] Arrival date & time: 04/05/16  1436     History   Chief Complaint Chief Complaint  Patient presents with  . Abdominal Pain    HPI Taylor Long is a 76 y.o. female.  HPI  Pt was seen at 1510. Per pt, c/o gradual onset and worsening of persistent abd "pain" for the past several months. Pt states her abd "just hurts when you touch it" approximately around her umbilicus, for the past several months. Pt states she was evaluated by her PMD today, and was sent to the Hospital for outpatient [place]. US revealed 8cm AAA, and pt was then sent to the ED for further evaluation. Denies abd pain without palpitation, no flank or back pain, no CP/SOB, no N/V/D, no focal motor weakness.   Past Medical History:  Diagnosis Date  . Hypertension     Patient Active Problem List   Diagnosis Date Noted  . PELVIC  PAIN 10/07/2008  . DIABETES MELLITUS, TYPE II 08/10/2008  . HYPERLIPIDEMIA 07/13/2008  . TOBACCO ABUSE 07/13/2008  . ANXIETY 01/04/2008  . HYPERTENSION 01/04/2008  . LEG CRAMPS 01/04/2008  . PALPITATIONS 01/04/2008    History reviewed. No pertinent surgical history.  OB History    Gravida Para Term Preterm AB Living             4   SAB TAB Ectopic Multiple Live Births                   Home Medications    Prior to Admission medications   Medication Sig Start Date End Date Taking? Authorizing Provider  hydrochlorothiazide (HYDRODIURIL) 25 MG tablet Take 25 mg by mouth daily.  05/01/15   Historical Provider, MD  ibuprofen (ADVIL,MOTRIN) 200 MG tablet Take 200 mg by mouth every 6 (six) hours as needed for moderate pain.    Historical Provider, MD  LORazepam (ATIVAN) 0.5 MG tablet Take 0.25-0.5 mg by mouth 2 (two) times daily as needed for anxiety. 03/08/16   Historical Provider, MD  losartan (COZAAR) 50 MG tablet Take 1 tablet by mouth daily. 02/02/16   Historical Provider, MD  metoprolol (LOPRESSOR) 50 MG tablet Take 1 tablet (50 mg  total) by mouth 2 (two) times daily. Take 25 mg (1/2 tablet) in the morning and 50mg  in the evening. 03/18/16   Raeford Razor, MD  metoprolol succinate (TOPROL-XL) 25 MG 24 hr tablet Take 1 tablet by mouth daily. 03/08/16   Historical Provider, MD  ondansetron (ZOFRAN) 4 MG tablet Take 1 tablet (4 mg total) by mouth every 8 (eight) hours as needed for nausea or vomiting. 03/18/16   Raeford Razor, MD    Family History History reviewed. No pertinent family history.  Social History Social History  Substance Use Topics  . Smoking status: Former Games developer  . Smokeless tobacco: Never Used  . Alcohol use No     Allergies   Latex   Review of Systems Review of Systems ROS: Statement: All systems negative except as marked or noted in the HPI; Constitutional: Negative for fever and chills. ; ; Eyes: Negative for eye pain, redness and discharge. ; ; ENMT: Negative for ear pain, hoarseness, nasal congestion, sinus pressure and sore throat. ; ; Cardiovascular: Negative for chest pain, palpitations, diaphoresis, dyspnea and peripheral edema. ; ; Respiratory: Negative for cough, wheezing and stridor. ; ; Gastrointestinal: +abd pain. Negative for nausea, vomiting, diarrhea, blood in stool, hematemesis, jaundice and rectal bleeding. . ; ;  Genitourinary: Negative for dysuria, flank pain and hematuria. ; ; Musculoskeletal: Negative for back pain and neck pain. Negative for swelling and trauma.; ; Skin: Negative for pruritus, rash, abrasions, blisters, bruising and skin lesion.; ; Neuro: Negative for headache, lightheadedness and neck stiffness. Negative for weakness, altered level of consciousness, altered mental status, extremity weakness, paresthesias, involuntary movement, seizure and syncope.       Physical Exam Updated Vital Signs BP 169/97   Pulse 71   Temp 98.2 F (36.8 C) (Oral)   Resp 21   Ht 5\' 6"  (1.676 m)   Wt 153 lb (69.4 kg)   SpO2 98%   BMI 24.69 kg/m    Patient Vitals for the past 24  hrs:  BP Temp Temp src Pulse Resp SpO2 Height Weight  04/05/16 1930 166/95 - - 73 15 94 % - -  04/05/16 1900 169/97 - - 71 21 98 % - -  04/05/16 1849 (!) 201/96 - - 69 20 98 % - -  04/05/16 1837 (!) 215/114 - - 77 20 100 % - -  04/05/16 1751 (!) 187/104 - - 78 18 100 % - -  04/05/16 1450 (!) 118/113 98.2 F (36.8 C) Oral 63 18 98 % 5\' 6"  (1.676 m) 153 lb (69.4 kg)     Physical Exam 1515: Physical examination:  Nursing notes reviewed; Vital signs and O2 SAT reviewed;  Constitutional: Well developed, Well nourished, Well hydrated, In no acute distress; Head:  Normocephalic, atraumatic; Eyes: EOMI, PERRL, No scleral icterus; ENMT: Mouth and pharynx normal, Mucous membranes moist; Neck: Supple, Full range of motion, No lymphadenopathy; Cardiovascular: Regular rate and rhythm, No gallop; Respiratory: Breath sounds clear & equal bilaterally, No wheezes.  Speaking full sentences with ease, Normal respiratory effort/excursion; Chest: Nontender, Movement normal; Abdomen: Soft, +mild tenderness central abd. No rebound or guarding.  Nondistended, Normal bowel sounds; Genitourinary: No CVA tenderness; Extremities: Pulses normal, No tenderness, No edema, No calf edema or asymmetry.; Neuro: AA&Ox3, Major CN grossly intact.  Speech clear. No gross focal motor or sensory deficits in extremities.; Skin: Color normal, Warm, Dry.   ED Treatments / Results  Labs (all labs ordered are listed, but only abnormal results are displayed)   EKG  EKG Interpretation None       Radiology   Procedures Procedures (including critical care time)  Medications Ordered in ED Medications - No data to display   Initial Impression / Assessment and Plan / ED Course  I have reviewed the triage vital signs and the nursing notes.  Pertinent labs & imaging results that were available during my care of the patient were reviewed by me and considered in my medical decision making (see chart for details).  MDM Reviewed:  previous chart, nursing note and vitals Reviewed previous: labs Interpretation: labs, ultrasound and CT scan Consults: Vascular Surgery.   CRITICAL CARE Performed by: Laray AngerMCMANUS,Joriel Streety M Total critical care time: 35 minutes Critical care time was exclusive of separately billable procedures and treating other patients. Critical care was necessary to treat or prevent imminent or life-threatening deterioration. Critical care was time spent personally by me on the following activities: development of treatment plan with patient and/or surrogate as well as nursing, discussions with consultants, evaluation of patient's response to treatment, examination of patient, obtaining history from patient or surrogate, ordering and performing treatments and interventions, ordering and review of laboratory studies, ordering and review of radiographic studies, pulse oximetry and re-evaluation of patient's condition.   Results for orders placed or  performed during the hospital encounter of 04/05/16  Comprehensive metabolic panel  Result Value Ref Range   Sodium 136 135 - 145 mmol/L   Potassium 3.1 (L) 3.5 - 5.1 mmol/L   Chloride 94 (L) 101 - 111 mmol/L   CO2 32 22 - 32 mmol/L   Glucose, Bld 108 (H) 65 - 99 mg/dL   BUN 18 6 - 20 mg/dL   Creatinine, Ser 1.19 0.44 - 1.00 mg/dL   Calcium 14.7 8.9 - 82.9 mg/dL   Total Protein 8.8 (H) 6.5 - 8.1 g/dL   Albumin 4.5 3.5 - 5.0 g/dL   AST 22 15 - 41 U/L   ALT 17 14 - 54 U/L   Alkaline Phosphatase 79 38 - 126 U/L   Total Bilirubin 0.6 0.3 - 1.2 mg/dL   GFR calc non Af Amer 57 (L) >60 mL/min   GFR calc Af Amer >60 >60 mL/min   Anion gap 10 5 - 15  Lipase, blood  Result Value Ref Range   Lipase 18 11 - 51 U/L  CBC with Differential  Result Value Ref Range   WBC 8.4 4.0 - 10.5 K/uL   RBC 4.99 3.87 - 5.11 MIL/uL   Hemoglobin 13.7 12.0 - 15.0 g/dL   HCT 56.2 13.0 - 86.5 %   MCV 82.0 78.0 - 100.0 fL   MCH 27.5 26.0 - 34.0 pg   MCHC 33.5 30.0 - 36.0 g/dL    RDW 78.4 69.6 - 29.5 %   Platelets 343 150 - 400 K/uL   Neutrophils Relative % 63 %   Neutro Abs 5.2 1.7 - 7.7 K/uL   Lymphocytes Relative 29 %   Lymphs Abs 2.5 0.7 - 4.0 K/uL   Monocytes Relative 7 %   Monocytes Absolute 0.6 0.1 - 1.0 K/uL   Eosinophils Relative 1 %   Eosinophils Absolute 0.1 0.0 - 0.7 K/uL   Basophils Relative 0 %   Basophils Absolute 0.0 0.0 - 0.1 K/uL  Sample to Blood Bank  Result Value Ref Range   Blood Bank Specimen SAMPLE AVAILABLE FOR TESTING    Sample Expiration 04/08/2016    US Abdomen Complete Result Date: 04/05/2016 CLINICAL DATA:  Upper abdominal pain for the past 1-2 days. EXAM: ABDOMEN ULTRASOUND COMPLETE COMPARISON:  None. FINDINGS: Gallbladder: There is a approximately 2.9 cm echogenic shadowing stone within the fundus of an otherwise normal-appearing gallbladder. No gallbladder wall thickening or pericholecystic fluid. Negative sonographic Murphy's sign. Common bile duct: Normal in size measuring 2.3 mm in diameter Liver: Homogeneous hepatic echotexture. No discrete hepatic lesions. No definite evidence of intrahepatic biliary ductal dilatation. No ascites. IVC: No abnormality visualized. Pancreas: Visualized portion unremarkable. Spleen: Normal in size measuring 2.9 cm in length Right Kidney: Normal cortical thickness, echogenicity and size, measuring 11.5 cm in length. No focal renal lesions. No echogenic renal stones. No urinary obstruction. Left Kidney: Normal cortical thickness, echogenicity and size, measuring 8.5 cm in length. No focal renal lesions. No echogenic renal stones. No urinary obstruction. Abdominal aorta: There is fusiform aneurysmal dilatation of the abdominal aorta measuring at least 8 cm in diameter with crescentic mural thrombus (image 103). The abdominal aorta appears to taper to a normal caliber at the level the bifurcation, however visualization is difficult secondary to bowel gas. Other findings: None. IMPRESSION: 1. Cholelithiasis  without evidence of cholecystitis. 2. 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 Further evaluation with CTA of the abdomen pelvis is recommended. Critical Value/emergent results were  called by telephone at the time of interpretation on 04/05/2016 at 2:24 pm to Dr. Gareth Morgan , who verbally acknowledged these results. Electronically Signed   By: Simonne Come M.D.   On: 04/05/2016 14:30    Ct Angio Chest/abd/pel For Dissection W And/or Wo Contrast Result Date: 04/05/2016 CLINICAL DATA:  76 year old female with abdominal pain and abdominal aortic aneurysm. EXAM: CT CHEST, ABDOMEN, AND PELVIS WITH CONTRAST TECHNIQUE: Multidetector CT imaging of the chest, abdomen and pelvis was performed following the standard protocol during bolus administration of intravenous contrast. CONTRAST:  100 cc IV contrast COMPARISON:  None. FINDINGS: CT CHEST FINDINGS Cardiovascular: Heart: No cardiomegaly. No pericardial fluid/thickening. Calcifications of left main, left anterior descending, right coronary arteries. Aorta: Atherosclerotic changes of the thoracic aorta. No dissection flap. No periaortic fluid. No aneurysm of the thoracic aorta. Significant irregular soft plaque of the descending thoracic aorta. Pulmonary arteries: No central, lobar, segmental, or proximal subsegmental filling defects. Mediastinum/Nodes: Mediastinal lymph nodes are present, none of which are enlarged by CT size criteria. Unremarkable appearance of the thoracic esophagus. Unremarkable appearance of the thoracic inlet and thyroid. Lungs/Pleura: No confluent airspace disease. 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 No pleural effusion. No significant and a bronchial thickening. 5 mm nodule of the lingula with no comparison. 4 mm nodule at the medial left lung base. No pneumothorax. Musculoskeletal: No displaced fracture. Degenerative changes of the spine. Review of the MIP images confirms the  above findings. CT ABDOMEN PELVIS FINDINGS VASCULAR Aorta: Infrarenal abdominal aortic aneurysm with greatest diameter on the axial images measuring 8.0 cm. Mural calcifications are present with thickened aortic wall circumferentially beyond the mural calcifications. This is most pronounced at the level of the greatest dilation and extending inferiorly towards the aortic bifurcation. Mural thrombus present within the aneurysm sac approximately 75% of the circumference with flow lumen maintained. Diameter at the aorta at the renal arteries measures 2.5 cm with irregular plaque. No periaortic fluid.  No dissection flap. Celiac: Atherosclerotic changes at the origin of the celiac artery which remains patent, with likely 50% or greater stenosis. Accessory left hepatic artery. Splenic artery remains patent. SMA: Atherosclerotic changes at the origin of the superior mesenteric artery. No significant stenosis or occlusion. Replaced right hepatic artery. 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 Atherosclerotic changes at the origin of the right renal artery with at least 50% stenosis. Partial opacification of the left renal artery, potentially secondary to collateral flow. IMA: Inferior mesenteric artery appears occluded at the origin. Right lower extremity: Atherosclerotic changes extend from the aortic bifurcation to the right iliac artery. No aneurysm or dissection flap. Hypogastric artery remains patent. External iliac artery patent. Common femoral artery patent. Proximal femoral vasculature patent. Left lower extremity: Atherosclerotic changes extend from the bifurcation into the left common iliac artery. No significant stenosis or occlusion. No dissection. No iliac aneurysm. Hypogastric artery remains patent. External iliac artery patent. Common femoral artery patent including the proximal femoral system. Veins: Unremarkable appearance of the venous system. Review of  the MIP images confirms the above findings. NON-VASCULAR Hepatobiliary: Unremarkable appearance of the liver. Unremarkable gall bladder. Pancreas: Unremarkable appearance of the pancreas. No pericholecystic fluid or inflammatory changes. Unremarkable ductal system. Spleen: Unremarkable. Adrenals/Urinary Tract: Bilateral adrenal glands unremarkable. Right kidney without hydronephrosis or nephrolithiasis. Low-density lesion at the inferior right cortex, incompletely characterized. No nephrolithiasis or perinephric fluid. Left kidney demonstrates no perfusion compared to the right. Trace perinephric stranding. Diameter of  the left kidney is slightly reduced compared to the right. Left measures 8.1 cm and right measures 11.7 cm. Stomach/Bowel: Unremarkable appearance of stomach, small bowel. No abnormal distention. Colonic diverticula without evidence of acute diverticulitis. Appendix is not visualized, however, no inflammatory changes are present adjacent to the cecum to indicate an appendicitis. Lymphatic: Multiple lymph nodes in the para-aortic nodal station, none of which are enlarged. Mesenteric: No free fluid or air. No adenopathy. Reproductive: Hysterectomy Other: No abdominal wall hernia. Musculoskeletal: No displaced fracture. Mild degenerative changes of the spine. No bony canal narrowing. Minimal degenerative changes of the hips. IMPRESSION: Although there are no specific findings of acute aortic syndrome, there is an infrarenal abdominal aortic aneurysm measuring 8 cm, and the aortic wall appears somewhat thickened/irregular, potentially inflammatory/reactive. Vascular consultation is recommended. 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 The left kidney measures 8.1 cm compared to the right measuring 11.7 cm. Although the chronicity uncertain, findings may represent acute renal artery thrombosis superimposed on chronic stenosis, and could represent a source of left-sided abdominal  pain. The above results were called by telephone at the time of interpretation on 04/05/2016 at 5:31 pm to Dr. Samuel Jester , who verbally acknowledged these results. Aortic atherosclerosis with severe mural soft plaque throughout the length of the thoracic aorta and abdominal aorta including significant mural thrombus/ plaque within the aneurysm sac. 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 Nodule of the lingula measuring 5 mm and nodule of the left lower lobe measuring 4 mm. Given the patient risk factors, CT follow-up in 6 months is recommended, as suggested by the updated Fleischner Society guidelines. Diverticular disease without evidence of acute diverticulitis Signed, Yvone Neu. Loreta Ave, DO [facility] Radiology Electronically Signed   By: Gilmer Mor D.O.   On: 04/05/2016 17:32    1755:  T/C to [facility] Vascular Surgeon Dr. Edilia Bo, case discussed, including:  HPI, pertinent PM/SHx, VS/PE, dx testing, ED course and treatment:  Requests to transfer pt to [facility] ED for further evaluation/admission. Dx and testing d/w pt.  Questions answered.  Verb understanding, agreeable to transfer to [facility] for admit. T/C to EDP Dr. Clydene Pugh: given pt report.   1830:  Pt with hx of HTN: baseline BP's 230's/120's per ED visit 2 weeks ago; pt given IV labetalol and rx metoprolol.  Pt's BP increasing now; will dose IV labetalol now. T/C to Vasc MD to update: they will call me back. Pt continues AAOx3, resps easy. Denies CP/SOB and abd pain (without palpation).   1930:  BP improving after IV labetalol. Will continue to monitor. Pt continues to deny abd pain.       Final Clinical Impressions(s) / ED Diagnoses   Final diagnoses:  None    New Prescriptions New Prescriptions   No medications on file     Samuel Jester, DO 04/10/16 7829

## 2016-04-06 ENCOUNTER — Encounter (HOSPITAL_COMMUNITY): Payer: Self-pay

## 2016-04-06 DIAGNOSIS — I110 Hypertensive heart disease with heart failure: Secondary | ICD-10-CM

## 2016-04-06 DIAGNOSIS — Z8679 Personal history of other diseases of the circulatory system: Secondary | ICD-10-CM

## 2016-04-06 DIAGNOSIS — R002 Palpitations: Secondary | ICD-10-CM

## 2016-04-06 DIAGNOSIS — E876 Hypokalemia: Secondary | ICD-10-CM

## 2016-04-06 LAB — COMPREHENSIVE METABOLIC PANEL
ALK PHOS: 74 U/L (ref 38–126)
ALT: 16 U/L (ref 14–54)
AST: 22 U/L (ref 15–41)
Albumin: 3.8 g/dL (ref 3.5–5.0)
Anion gap: 12 (ref 5–15)
BUN: 17 mg/dL (ref 6–20)
CO2: 31 mmol/L (ref 22–32)
CREATININE: 1.17 mg/dL — AB (ref 0.44–1.00)
Calcium: 9.5 mg/dL (ref 8.9–10.3)
Chloride: 92 mmol/L — ABNORMAL LOW (ref 101–111)
GFR calc Af Amer: 52 mL/min — ABNORMAL LOW (ref >60)
GFR, EST NON AFRICAN AMERICAN: 45 mL/min — AB (ref >60)
Glucose, Bld: 142 mg/dL — ABNORMAL HIGH (ref 65–99)
Potassium: 2.9 mmol/L — ABNORMAL LOW (ref 3.5–5.1)
Sodium: 135 mmol/L (ref 135–145)
Total Bilirubin: 0.5 mg/dL (ref 0.3–1.2)
Total Protein: 7.4 g/dL (ref 6.5–8.1)

## 2016-04-06 LAB — BASIC METABOLIC PANEL
ANION GAP: 13 (ref 5–15)
BUN: 15 mg/dL (ref 6–20)
CALCIUM: 9.6 mg/dL (ref 8.9–10.3)
CO2: 27 mmol/L (ref 22–32)
Chloride: 95 mmol/L — ABNORMAL LOW (ref 101–111)
Creatinine, Ser: 1.23 mg/dL — ABNORMAL HIGH (ref 0.44–1.00)
GFR, EST AFRICAN AMERICAN: 49 mL/min — AB (ref >60)
GFR, EST NON AFRICAN AMERICAN: 42 mL/min — AB (ref >60)
Glucose, Bld: 104 mg/dL — ABNORMAL HIGH (ref 65–99)
Potassium: 3.1 mmol/L — ABNORMAL LOW (ref 3.5–5.1)
Sodium: 135 mmol/L (ref 135–145)

## 2016-04-06 LAB — URINALYSIS, ROUTINE W REFLEX MICROSCOPIC
BACTERIA UA: NONE SEEN
Bilirubin Urine: NEGATIVE
Glucose, UA: NEGATIVE mg/dL
Hgb urine dipstick: NEGATIVE
KETONES UR: NEGATIVE mg/dL
Nitrite: NEGATIVE
PH: 6 (ref 5.0–8.0)
PROTEIN: NEGATIVE mg/dL
Specific Gravity, Urine: >1.046 — ABNORMAL HIGH (ref 1.005–1.030)

## 2016-04-06 LAB — PROTIME-INR
INR: 1.07
Prothrombin Time: 13.9 seconds (ref 11.4–15.2)

## 2016-04-06 LAB — CBC
HEMATOCRIT: 36.7 % (ref 36.0–46.0)
HEMOGLOBIN: 12 g/dL (ref 12.0–15.0)
MCH: 26.9 pg (ref 26.0–34.0)
MCHC: 32.7 g/dL (ref 30.0–36.0)
MCV: 82.3 fL (ref 78.0–100.0)
PLATELETS: 323 10*3/uL (ref 150–400)
RBC: 4.46 MIL/uL (ref 3.87–5.11)
RDW: 13.1 % (ref 11.5–15.5)
WBC: 8.3 10*3/uL (ref 4.0–10.5)

## 2016-04-06 MED ORDER — ADULT MULTIVITAMIN W/MINERALS CH
1.0000 | ORAL_TABLET | Freq: Every day | ORAL | Status: DC
  Administered 2016-04-06 – 2016-04-11 (×5): 1 via ORAL
  Filled 2016-04-06 (×5): qty 1

## 2016-04-06 MED ORDER — PHENOL 1.4 % MT LIQD: 1.0000 | OROMUCOSAL | Status: DC | PRN

## 2016-04-06 MED ORDER — PANTOPRAZOLE SODIUM 40 MG PO TBEC
40.0000 mg | DELAYED_RELEASE_TABLET | Freq: Every day | ORAL | Status: DC
  Administered 2016-04-06 – 2016-04-09 (×4): 40 mg via ORAL
  Filled 2016-04-06 (×4): qty 1

## 2016-04-06 MED ORDER — KCL IN DEXTROSE-NACL 20-5-0.45 MEQ/L-%-% IV SOLN
INTRAVENOUS | Status: DC
Start: 2016-04-06 — End: 2016-04-09
  Administered 2016-04-06 – 2016-04-09 (×5): via INTRAVENOUS
  Filled 2016-04-06 (×5): qty 1000

## 2016-04-06 MED ORDER — METOPROLOL TARTRATE 5 MG/5ML IV SOLN
2.0000 mg | INTRAVENOUS | Status: DC | PRN
Start: 2016-04-06 — End: 2016-04-09

## 2016-04-06 MED ORDER — ENOXAPARIN SODIUM 40 MG/0.4ML ~~LOC~~ SOLN
40.0000 mg | SUBCUTANEOUS | Status: DC
  Administered 2016-04-06 – 2016-04-08 (×3): 40 mg via SUBCUTANEOUS
  Filled 2016-04-06 (×3): qty 0.4

## 2016-04-06 MED ORDER — PREMIER PROTEIN SHAKE
11.0000 [oz_av] | ORAL | Status: DC
  Administered 2016-04-06 – 2016-04-11 (×6): 11 [oz_av] via ORAL
  Filled 2016-04-06 (×9): qty 325.31

## 2016-04-06 MED ORDER — LABETALOL HCL 5 MG/ML IV SOLN: 20.0000 mg | Freq: Once | INTRAVENOUS | Status: DC

## 2016-04-06 MED ORDER — POTASSIUM CHLORIDE CRYS ER 20 MEQ PO TBCR
20.0000 meq | EXTENDED_RELEASE_TABLET | Freq: Once | ORAL | Status: AC
  Administered 2016-04-06: 40 meq via ORAL
  Filled 2016-04-06: qty 2

## 2016-04-06 MED ORDER — ONDANSETRON HCL 4 MG/2ML IJ SOLN: 4.0000 mg | Freq: Four times a day (QID) | INTRAMUSCULAR | Status: DC | PRN

## 2016-04-06 MED ORDER — HYDRALAZINE HCL 20 MG/ML IJ SOLN
5.0000 mg | INTRAMUSCULAR | Status: AC | PRN
  Administered 2016-04-08 (×2): 5 mg via INTRAVENOUS
  Filled 2016-04-06 (×2): qty 1

## 2016-04-06 MED ORDER — ALUM & MAG HYDROXIDE-SIMETH 200-200-20 MG/5ML PO SUSP: 15.0000 mL | ORAL | Status: DC | PRN

## 2016-04-06 MED ORDER — GUAIFENESIN-DM 100-10 MG/5ML PO SYRP: 15.0000 mL | ORAL_SOLUTION | ORAL | Status: DC | PRN

## 2016-04-06 MED ORDER — POTASSIUM CHLORIDE CRYS ER 10 MEQ PO TBCR
10.0000 meq | EXTENDED_RELEASE_TABLET | ORAL | Status: AC
  Administered 2016-04-06 – 2016-04-07 (×6): 10 meq via ORAL
  Filled 2016-04-06 (×6): qty 1

## 2016-04-06 MED ORDER — METOPROLOL TARTRATE 50 MG PO TABS
50.0000 mg | ORAL_TABLET | Freq: Three times a day (TID) | ORAL | Status: DC
  Administered 2016-04-06 – 2016-04-11 (×17): 50 mg via ORAL
  Filled 2016-04-06 (×17): qty 1

## 2016-04-06 NOTE — Progress Notes (Signed)
New Admission Note:   Arrival Method: From ED via stretcher Mental Orientation: A&O Telemetry: Box 2w05 Assessment: Completed Skin: Intact IV: L FA Pain: denies any pain Tubes: None Safety Measures: Safety Fall Prevention Plan has been discussed  Admission 2 West Orientation: Patient has been orientated to the room, unit and staff.  Family: none present at bedside  Orders to be reviewed and implemented. Will continue to monitor the patient. Call light has been placed within reach and bed alarm has been activated. Tele Box applied, CCMD notified    Gregor HamsAlisha Alyus Mofield, RN

## 2016-04-06 NOTE — Progress Notes (Addendum)
  Progress Note  VASCULAR SURGERY ASSESSMENT AND PLAN:  8 CM AAA: Not a candidate for endovascular repair. Plan open repair of AAA on Tuesday if cleared by Cardiology. I have asked Dr. Clide CliffKline to see. I had a long discussion with her again this morning about surgery.   HTN: BP poorly controlled despite a beta blocker, ARB, and HCTZ.  Cari Carawayhris Dane Kopke, MD [phone]   Hospital Day 1  Subjective:  Sleeping soundly and I did not wake her-will check back later.  Afebrile HR 60's-70's  140's-180's systolic 98% RA  Vitals:   04/06/16 0020 04/06/16 0500  BP: (!) 163/94 (!) 171/70  Pulse: 71 64  Resp: 20 18  Temp: 98.5 F (36.9 C) 98.3 F (36.8 C)    CBC    Component Value Date/Time   WBC 8.3 04/06/2016 0123   RBC 4.46 04/06/2016 0123   HGB 12.0 04/06/2016 0123   HCT 36.7 04/06/2016 0123   PLT 323 04/06/2016 0123   MCV 82.3 04/06/2016 0123   MCH 26.9 04/06/2016 0123   MCHC 32.7 04/06/2016 0123   RDW 13.1 04/06/2016 0123   LYMPHSABS 2.5 04/05/2016 1457   MONOABS 0.6 04/05/2016 1457   EOSABS 0.1 04/05/2016 1457   BASOSABS 0.0 04/05/2016 1457    BMET    Component Value Date/Time   NA 135 04/06/2016 0123   K 2.9 (L) 04/06/2016 0123   CL 92 (L) 04/06/2016 0123   CO2 31 04/06/2016 0123   GLUCOSE 142 (H) 04/06/2016 0123   BUN 17 04/06/2016 0123   CREATININE 1.17 (H) 04/06/2016 0123   CALCIUM 9.5 04/06/2016 0123   GFRNONAA 45 (L) 04/06/2016 0123   GFRAA 52 (L) 04/06/2016 0123    INR    Component Value Date/Time   INR 1.07 04/06/2016 0123     Intake/Output Summary (Last 24 hours) at 04/06/16 0740 Last data filed at 04/06/16 0020  Gross per 24 hour  Intake                0 ml  Output              350 ml  Net             -350 ml     Assessment/Plan:  76 y.o. female with uncontrolled hypertension and 8cm abdominal aortic aneurysm Hospital Day 1  -pt sleeping soundly - did not wake her and will check back later -she has uncontrolled hypertension - she is on  an ARB, BB and HCTZ currently -will need cardiac clearance for elective repair tentatively Tuesday -hypokalemia-supplement-received K-dur 40mEq this morning at 0115 and her labs were drawn at 0125.  Also receiving IVF with K+ supplement now.  -creatinine up slightly this am after CTA -recheck labs in am - by Dr. Edilia Boickson if she is cleared medically   Doreatha MassedSamantha Rhyne, PA-C [facility] [phone] 04/06/2016 7:40 AM

## 2016-04-06 NOTE — Progress Notes (Signed)
Initial Nutrition Assessment   INTERVENTION:  Provide Premier Protein Shake once daily, provides 160 kcal and 30 grams of protein Multivitamin with minerals daily Discussed low sodium diet and sodium-free flavoring tips   NUTRITION DIAGNOSIS:   Predicted suboptimal nutrient intake related to poor appetite as evidenced by per patient/family report, mild depletion of muscle mass.   GOAL:   Patient will meet greater than or equal to 90% of their needs   MONITOR:   PO intake, Supplement acceptance, Skin, Weight trends, Labs, I & O's  REASON FOR ASSESSMENT:   Malnutrition Screening Tool    ASSESSMENT:   76 y.o. female with history of HTN, seen by her primary care physician for a routine follow up visit and was found to have a pulsatile abdominal mass. She was sent to the emergency department and CT scan showed an 8 cm aneurysm.   Pt states that she has had a decreased appetite for the past few months and lost from 160 lbs to 151 lbs. She states that she hasn't felt like preparing food and has been eating out 1-2 times daily- usually a burger or a fish sandwich. She will snack on small amounts of fruits and vegetables some days. Weight loss is not significant for time frame. She does have some mild muscle wasting in legs. She states that her appetite is good now. She reports eating about 50% of breakfast because the food was bland. RD discussed a low sodium diet for control of HTN. Pt states she has been eating a lot of soup recently. She states she can cook more at home, eat more baked fish, fruits and vegetables. She expects that she will eat well while here in the hospital.   Labs: low potassium  Diet Order:  Diet Heart Room service appropriate? Yes; Fluid consistency: Thin  Skin:  Reviewed, no issues  Last BM:  3/9  Height:   Ht Readings from Last 1 Encounters:  04/06/16 5\' 6"  (1.676 m)    Weight:   Wt Readings from Last 1 Encounters:  04/06/16 151 lb 11.2 oz (68.8  kg)    Ideal Body Weight:  59.09 kg  BMI:  Body mass index is 24.49 kg/m.  Estimated Nutritional Needs:   Kcal:  1600-1800  Protein:  80-90 grams  Fluid:  1.8 L/day  EDUCATION NEEDS:   No education needs identified at this time  Dorothea Ogleeanne Ayeshia Coppin RD, LDN, CSP Inpatient Clinical Dietitian Pager: [phone] After Hours Pager: [phone]

## 2016-04-06 NOTE — Consult Note (Addendum)
CARDIOLOGY CONSULT NOTE  Patient ID: Taylor Long, MRN: [id number], DOB/AGE: 74-Dec-1942 74 y.o. Admit date: 04/05/2016 Date of Consult: 04/06/2016  Primary Physician: Milana Obey, MD Primary Cardiologist: new  Consulting Physician Edilia Bo  Chief Complaint: preoperative eval preop   HPI Taylor Long is a 76 y.o. female  Whom we are asked to see for preoperative evaluation.  She was admitted to hospital following evaluation primary care office and by vascular surgery where she was found to have an 8 cm abdominal aortic aneurysm  She has a history of hypertension. This is been quite accelerated over recent years.  She has significant impairment in exercise tolerance. She is unable to climb a flight of stairs. She is unable to carry any grossly really home from the bus. This is characterized by shortness of breath and fatigue; she denies chest pain. She does not have peripheral edema. She does not have nocturnal dyspnea.  She has recurrent tachypalpitations that are irregular. They have not yet being characterized.  Past Medical History:  Diagnosis Date  . Hypertension       Surgical History: History reviewed. No pertinent surgical history.   Home Meds: Prior to Admission medications   Medication Sig Start Date End Date Taking? Authorizing Provider  hydrochlorothiazide (HYDRODIURIL) 25 MG tablet Take 25 mg by mouth daily.  05/01/15  Yes Historical Provider, MD  ibuprofen (ADVIL,MOTRIN) 200 MG tablet Take 200 mg by mouth every 6 (six) hours as needed for moderate pain.   Yes Historical Provider, MD  LORazepam (ATIVAN) 0.5 MG tablet Take 0.25-0.5 mg by mouth 2 (two) times daily as needed for anxiety. 03/08/16  Yes Historical Provider, MD  losartan (COZAAR) 50 MG tablet Take 1 tablet by mouth daily. 02/02/16  Yes Historical Provider, MD  metoprolol succinate (TOPROL-XL) 25 MG 24 hr tablet Take 1 tablet by mouth daily. 03/08/16  Yes Historical Provider, MD  metoprolol  (LOPRESSOR) 50 MG tablet Take 1 tablet (50 mg total) by mouth 2 (two) times daily. Take 25 mg (1/2 tablet) in the morning and 50mg  in the evening. Patient not taking: Reported on 04/05/2016 03/18/16   Raeford Razor, MD  metoprolol succinate (TOPROL-XL) 50 MG 24 hr tablet Take 50 mg by mouth daily. 04/05/16   Historical Provider, MD  ondansetron (ZOFRAN) 4 MG tablet Take 1 tablet (4 mg total) by mouth every 8 (eight) hours as needed for nausea or vomiting. 03/18/16   Raeford Razor, MD    Inpatient Medications:  . enoxaparin (LOVENOX) injection  40 mg Subcutaneous Q24H  . hydrochlorothiazide  25 mg Oral Daily  . losartan  50 mg Oral Daily  . metoprolol succinate  25 mg Oral Daily  . multivitamin with minerals  1 tablet Oral Daily  . pantoprazole  40 mg Oral Daily  . protein supplement shake  11 oz Oral Q24H    Allergies:  Allergies  Allergen Reactions  . Latex Other (See Comments)    Break out in a rash    Social History   Social History  . Marital status: Widowed    Spouse name: N/A  . Number of children: N/A  . Years of education: N/A   Occupational History  . Not on file.   Social History Main Topics  . Smoking status: Former Games developer  . Smokeless tobacco: Never Used  . Alcohol use No  . Drug use: No  . Sexual activity: Not on file   Other Topics Concern  . Not on  file   Social History Narrative  . No narrative on file    Fam Hx neg for CAD  ROS:  Please see the history of present illness.     All other systems reviewed and negative.    Physical Exam:  Blood pressure 134/73, pulse 77, temperature 97.9 F (36.6 C), temperature source Oral, resp. rate 18, height 5\' 6"  (1.676 m), weight 151 lb 11.2 oz (68.8 kg), SpO2 99 %. General: Well developed, well nourished female in no acute distress. Head: Normocephalic, atraumatic, sclera non-icteric, no xanthomas, nares are without discharge. EENT: normal  Lymph Nodes:  none Neck: Negative for carotid bruits. JVD not  elevated. Back:without scoliosis kyphosis Lungs: Clear bilaterally to auscultation without wheezes, rales, or rhonchi. Breathing is unlabored. Heart: RRR with S1 S2. No murmur . No rubs, or gallops appreciated. Abdomen: Soft, non-tender, non-distended with normoactive bowel sounds. No hepatomegaly. No rebound/guarding. Obvious abdominal mass-pulsatile  Msk:  Strength and tone appear normal for age. Extremities: No clubbing or cyanosis. No  edema.  Distal pedal pulses are 2+ and equal bilaterally. Skin: Warm and Dry Neuro: Alert and oriented X 3. CN III-XII intact Grossly normal sensory and motor function . Psych:  Responds to questions appropriately with a normal affect.      Labs: Cardiac Enzymes No results for input(s): CKTOTAL, CKMB, TROPONINI in the last 72 hours. CBC Lab Results  Component Value Date   WBC 8.3 04/06/2016   HGB 12.0 04/06/2016   HCT 36.7 04/06/2016   MCV 82.3 04/06/2016   PLT 323 04/06/2016   PROTIME:  Recent Labs  04/06/16 0123  LABPROT 13.9  INR 1.07   Chemistry  Recent Labs Lab 04/06/16 0123  NA 135  K 2.9*  CL 92*  CO2 31  BUN 17  CREATININE 1.17*  CALCIUM 9.5  PROT 7.4  BILITOT 0.5  ALKPHOS 74  ALT 16  AST 22  GLUCOSE 142*   Lipids Lab Results  Component Value Date   CHOL 185 07/14/2008   HDL 43 07/14/2008   LDLCALC 104 (H) 07/14/2008   TRIG 188 (H) 07/14/2008   BNP No results found for: PROBNP Thyroid Function Tests: No results for input(s): TSH, T4TOTAL, T3FREE, THYROIDAB in the last 72 hours.  Invalid input(s): FREET3 Miscellaneous No results found for: DDIMER  Radiology/Studies:  US Abdomen Complete  Result Date: 04/05/2016 CLINICAL DATA:  Upper abdominal pain for the past 1-2 days. EXAM: ABDOMEN ULTRASOUND COMPLETE COMPARISON:  None. FINDINGS: Gallbladder: There is a approximately 2.9 cm echogenic shadowing stone within the fundus of an otherwise normal-appearing gallbladder. No gallbladder wall thickening or  pericholecystic fluid. Negative sonographic Murphy's sign. Common bile duct: Normal in size measuring 2.3 mm in diameter Liver: Homogeneous hepatic echotexture. No discrete hepatic lesions. No definite evidence of intrahepatic biliary ductal dilatation. No ascites. IVC: No abnormality visualized. Pancreas: Visualized portion unremarkable. Spleen: Normal in size measuring 2.9 cm in length Right Kidney: Normal cortical thickness, echogenicity and size, measuring 11.5 cm in length. No focal renal lesions. No echogenic renal stones. No urinary obstruction. Left Kidney: Normal cortical thickness, echogenicity and size, measuring 8.5 cm in length. No focal renal lesions. No echogenic renal stones. No urinary obstruction. Abdominal aorta: There is fusiform aneurysmal dilatation of the abdominal aorta measuring at least 8 cm in diameter with crescentic mural thrombus (image 103). The abdominal aorta appears to taper to a normal caliber at the level the bifurcation, however visualization is difficult secondary to bowel gas. Other findings: None. IMPRESSION: 1.  Cholelithiasis without evidence of cholecystitis. 2. 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 Further evaluation with CTA of the abdomen pelvis is recommended. Critical Value/emergent results were called by telephone at the time of interpretation on 04/05/2016 at 2:24 pm to Dr. Gareth MorganSTEVE KNOWLTON , who verbally acknowledged these results. Electronically Signed   By: Simonne ComeJohn  Watts M.D.   On: 04/05/2016 14:30   Dg Chest Portable 1 View  Result Date: 03/18/2016 CLINICAL DATA:  Anti hypertensive medication change 2 weeks ago with persistent headache and hypertension since. EXAM: PORTABLE CHEST 1 VIEW COMPARISON:  None in PACs FINDINGS: The lungs are well-expanded and clear. The heart and pulmonary vascularity are normal. The mediastinum is normal in width. There is calcification in the wall of the thoracic aorta. There is no pleural  effusion. The bony thorax is unremarkable. IMPRESSION: There is no CHF nor other acute cardiopulmonary abnormality. Thoracic aortic atherosclerosis. Electronically Signed   By: David  SwazilandJordan M.D.   On: 03/18/2016 11:46   Ct Angio Chest/abd/pel For Dissection W And/or Wo Contrast  Result Date: 04/05/2016 CLINICAL DATA:  76 year old female with abdominal pain and abdominal aortic aneurysm. EXAM: CT CHEST, ABDOMEN, AND PELVIS WITH CONTRAST TECHNIQUE: Multidetector CT imaging of the chest, abdomen and pelvis was performed following the standard protocol during bolus administration of intravenous contrast. CONTRAST:  100 cc IV contrast COMPARISON:  None. FINDINGS: CT CHEST FINDINGS Cardiovascular: Heart: No cardiomegaly. No pericardial fluid/thickening. Calcifications of left main, left anterior descending, right coronary arteries. Aorta: Atherosclerotic changes of the thoracic aorta. No dissection flap. No periaortic fluid. No aneurysm of the thoracic aorta. Significant irregular soft plaque of the descending thoracic aorta. Pulmonary arteries: No central, lobar, segmental, or proximal subsegmental filling defects. Mediastinum/Nodes: Mediastinal lymph nodes are present, none of which are enlarged by CT size criteria. Unremarkable appearance of the thoracic esophagus. Unremarkable appearance of the thoracic inlet and thyroid. Lungs/Pleura: No confluent airspace disease. 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 No pleural effusion. No significant and a bronchial thickening. 5 mm nodule of the lingula with no comparison. 4 mm nodule at the medial left lung base. No pneumothorax. Musculoskeletal: No displaced fracture. Degenerative changes of the spine. Review of the MIP images confirms the above findings. CT ABDOMEN PELVIS FINDINGS VASCULAR Aorta: Infrarenal abdominal aortic aneurysm with greatest diameter on the axial images measuring 8.0 cm. Mural calcifications are present with thickened aortic  wall circumferentially beyond the mural calcifications. This is most pronounced at the level of the greatest dilation and extending inferiorly towards the aortic bifurcation. Mural thrombus present within the aneurysm sac approximately 75% of the circumference with flow lumen maintained. Diameter at the aorta at the renal arteries measures 2.5 cm with irregular plaque. No periaortic fluid.  No dissection flap. Celiac: Atherosclerotic changes at the origin of the celiac artery which remains patent, with likely 50% or greater stenosis. Accessory left hepatic artery. Splenic artery remains patent. SMA: Atherosclerotic changes at the origin of the superior mesenteric artery. No significant stenosis or occlusion. Replaced right hepatic artery. 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 Atherosclerotic changes at the origin of the right renal artery with at least 50% stenosis. Partial opacification of the left renal artery, potentially secondary to collateral flow. IMA: Inferior mesenteric artery appears occluded at the origin. Right lower extremity: Atherosclerotic changes extend from the aortic bifurcation to the right iliac artery. No aneurysm or dissection flap. Hypogastric artery remains patent. External  iliac artery patent. Common femoral artery patent. Proximal femoral vasculature patent. Left lower extremity: Atherosclerotic changes extend from the bifurcation into the left common iliac artery. No significant stenosis or occlusion. No dissection. No iliac aneurysm. Hypogastric artery remains patent. External iliac artery patent. Common femoral artery patent including the proximal femoral system. Veins: Unremarkable appearance of the venous system. Review of the MIP images confirms the above findings. NON-VASCULAR Hepatobiliary: Unremarkable appearance of the liver. Unremarkable gall bladder. Pancreas: Unremarkable appearance of the pancreas. No pericholecystic fluid  or inflammatory changes. Unremarkable ductal system. Spleen: Unremarkable. Adrenals/Urinary Tract: Bilateral adrenal glands unremarkable. Right kidney without hydronephrosis or nephrolithiasis. Low-density lesion at the inferior right cortex, incompletely characterized. No nephrolithiasis or perinephric fluid. Left kidney demonstrates no perfusion compared to the right. Trace perinephric stranding. Diameter of the left kidney is slightly reduced compared to the right. Left measures 8.1 cm and right measures 11.7 cm. Stomach/Bowel: Unremarkable appearance of stomach, small bowel. No abnormal distention. Colonic diverticula without evidence of acute diverticulitis. Appendix is not visualized, however, no inflammatory changes are present adjacent to the cecum to indicate an appendicitis. Lymphatic: Multiple lymph nodes in the para-aortic nodal station, none of which are enlarged. Mesenteric: No free fluid or air. No adenopathy. Reproductive: Hysterectomy Other: No abdominal wall hernia. Musculoskeletal: No displaced fracture. Mild degenerative changes of the spine. No bony canal narrowing. Minimal degenerative changes of the hips. IMPRESSION: Although there are no specific findings of acute aortic syndrome, there is an infrarenal abdominal aortic aneurysm measuring 8 cm, and the aortic wall appears somewhat thickened/irregular, potentially inflammatory/reactive. Vascular consultation is recommended. 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 The left kidney measures 8.1 cm compared to the right measuring 11.7 cm. Although the chronicity uncertain, findings may represent acute renal artery thrombosis superimposed on chronic stenosis, and could represent a source of left-sided abdominal pain. The above results were called by telephone at the time of interpretation on 04/05/2016 at 5:31 pm to Dr. Samuel Jester , who verbally acknowledged these results. Aortic atherosclerosis with severe mural soft  plaque throughout the length of the thoracic aorta and abdominal aorta including significant mural thrombus/ plaque within the aneurysm sac. 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 Nodule of the lingula measuring 5 mm and nodule of the left lower lobe measuring 4 mm. Given the patient risk factors, CT follow-up in 6 months is recommended, as suggested by the updated Fleischner Society guidelines. Diverticular disease without evidence of acute diverticulitis Signed, Yvone Neu. Loreta Ave, DO [facility] Radiology Electronically Signed   By: Gilmer Mor D.O.   On: 04/05/2016 17:32   ECG personally reviewed 2/18 Sinus at 88 15/08/36 ST depress and TW inversions G:    Assessment and Plan:  Very large abdominal aortic aneurysm-8 cm  Congestive heart failure class III  Hypertension-poorly controlled  Palpitations  Hypokalemia   The patient is a very large abdominal aortic aneurysm which will require surgical repair. Her cardiac risks are significantly increased related to her heart failure. We will obtain an echocardiogram to look at left ventricular function. The context of her diabetes. Her exercise intolerance may well be ischemic. She does not have resting pain. She is euvolemic.   Her hypokalemiais concerning particularly in the context of her uncontrolled hypertension. We will send the renal aldo level. She will need aggressive potassium repletion before going to the operating room.  Her ECG is abnormal. We will obtain another one.  With her heart disease and blood pressure, her palpitations may well be atrial fibrillation. We will monitor her.       Sherryl Manges

## 2016-04-07 LAB — BASIC METABOLIC PANEL
Anion gap: 9 (ref 5–15)
BUN: 19 mg/dL (ref 6–20)
CO2: 27 mmol/L (ref 22–32)
Calcium: 9.1 mg/dL (ref 8.9–10.3)
Chloride: 99 mmol/L — ABNORMAL LOW (ref 101–111)
Creatinine, Ser: 1.12 mg/dL — ABNORMAL HIGH (ref 0.44–1.00)
GFR, EST AFRICAN AMERICAN: 55 mL/min — AB (ref >60)
GFR, EST NON AFRICAN AMERICAN: 48 mL/min — AB (ref >60)
Glucose, Bld: 118 mg/dL — ABNORMAL HIGH (ref 65–99)
Potassium: 3.5 mmol/L (ref 3.5–5.1)
SODIUM: 135 mmol/L (ref 135–145)

## 2016-04-07 LAB — CBC
HCT: 34.3 % — ABNORMAL LOW (ref 36.0–46.0)
Hemoglobin: 11 g/dL — ABNORMAL LOW (ref 12.0–15.0)
MCH: 26.7 pg (ref 26.0–34.0)
MCHC: 32.1 g/dL (ref 30.0–36.0)
MCV: 83.3 fL (ref 78.0–100.0)
PLATELETS: 281 10*3/uL (ref 150–400)
RBC: 4.12 MIL/uL (ref 3.87–5.11)
RDW: 13.4 % (ref 11.5–15.5)
WBC: 7.6 10*3/uL (ref 4.0–10.5)

## 2016-04-07 MED ORDER — ATORVASTATIN CALCIUM 10 MG PO TABS
10.0000 mg | ORAL_TABLET | Freq: Every day | ORAL | Status: DC
Start: 2016-04-07 — End: 2016-04-10
  Administered 2016-04-07 – 2016-04-09 (×3): 10 mg via ORAL
  Filled 2016-04-07 (×3): qty 1

## 2016-04-07 MED ORDER — ASPIRIN EC 81 MG PO TBEC
81.0000 mg | DELAYED_RELEASE_TABLET | Freq: Every day | ORAL | Status: DC
  Administered 2016-04-07 – 2016-04-11 (×5): 81 mg via ORAL
  Filled 2016-04-07 (×5): qty 1

## 2016-04-07 NOTE — Progress Notes (Addendum)
  Progress Note    04/07/2016 9:11 AM Hospital Day 2  Subjective:  Denies any abdominal pain.  Sleepy from loosing an hour of sleep and getting woken up at night.  Afebrile HR 60's-70's NSR 140's-150's systolic 97% RA  Vitals:   04/06/16 2333 04/07/16 0345  BP: (!) 155/83 (!) 147/59  Pulse: 72 68  Resp:  16  Temp:  98.4 F (36.9 C)    Physical Exam: General:  Sitting comfortably in no distress Lungs:  Non labored   CBC    Component Value Date/Time   WBC 8.3 04/06/2016 0123   RBC 4.46 04/06/2016 0123   HGB 12.0 04/06/2016 0123   HCT 36.7 04/06/2016 0123   PLT 323 04/06/2016 0123   MCV 82.3 04/06/2016 0123   MCH 26.9 04/06/2016 0123   MCHC 32.7 04/06/2016 0123   RDW 13.1 04/06/2016 0123   LYMPHSABS 2.5 04/05/2016 1457   MONOABS 0.6 04/05/2016 1457   EOSABS 0.1 04/05/2016 1457   BASOSABS 0.0 04/05/2016 1457    BMET    Component Value Date/Time   NA 135 04/07/2016 0333   K 3.5 04/07/2016 0333   CL 99 (L) 04/07/2016 0333   CO2 27 04/07/2016 0333   GLUCOSE 118 (H) 04/07/2016 0333   BUN 19 04/07/2016 0333   CREATININE 1.12 (H) 04/07/2016 0333   CALCIUM 9.1 04/07/2016 0333   GFRNONAA 48 (L) 04/07/2016 0333   GFRAA 55 (L) 04/07/2016 0333    INR    Component Value Date/Time   INR 1.07 04/06/2016 0123     Intake/Output Summary (Last 24 hours) at 04/07/16 0911 Last data filed at 04/06/16 2100  Gross per 24 hour  Intake              240 ml  Output                0 ml  Net              240 ml     Assessment/Plan:  76 y.o. female with uncontrolled HTN and 8cm AAA Hospital Day 2  -pt sitting comfortably and denies any abdominal pain -appreciate cardiology consult-getting echo  -hypokalemia improved today at 3.5 -creatinine improved this morning -if cleared by cardiology, plan for AAA repair Tuesday   Doreatha MassedSamantha Rhyne, PA-C [facility] [phone] 04/07/2016 9:11 AM  I have interviewed the patient and examined the patient. I  agree with the findings by the PA. Appreciate Dr. Odessa FlemingKlein's help. She has some CHF. Echo was done. Results are pending.  Her K is improving. Still being supplemented.  I will add ASA (81 mg) and a low dose statin (Lipitor 10 mg QD). Hopefully can proceed with repair of large (8 cm) AAA on Tuesday.  Waverly Ferrarihristopher Creedence Heiss, MD, FACS Beeper [phone] Office: [phone]   Cari Carawayhris Oshay Stranahan, MD [phone]

## 2016-04-07 NOTE — Progress Notes (Addendum)
Patient Name: Taylor Long      SUBJECTIVE:   admitted to hospital following evaluation primary care office and by vascular surgery where she was found to have an 8 cm abdominal aortic aneurysm  She has a history of hypertension. This is been quite accelerated over recent years.  She has significant impairment in exercise tolerance and palpitations   The patient denies chest pain, shortness of breath, nocturnal dyspnea, orthopnea or peripheral edema.  There have been no palpitations, lightheadedness or syncope.     Past Medical History:  Diagnosis Date  . Hypertension     Scheduled Meds:  Scheduled Meds: . enoxaparin (LOVENOX) injection  40 mg Subcutaneous Q24H  . hydrochlorothiazide  25 mg Oral Daily  . losartan  50 mg Oral Daily  . metoprolol tartrate  50 mg Oral Q8H  . multivitamin with minerals  1 tablet Oral Daily  . pantoprazole  40 mg Oral Daily  . potassium chloride  10 mEq Oral Q3H  . protein supplement shake  11 oz Oral Q24H   Continuous Infusions: . sodium chloride 75 mL/hr at 04/05/16 1840  . dextrose 5 % and 0.45 % NaCl with KCl 20 mEq/L 50 mL/hr at 04/06/16 1900   alum & mag hydroxide-simeth, guaiFENesin-dextromethorphan, hydrALAZINE, ibuprofen, LORazepam, metoprolol, ondansetron, ondansetron, phenol    PHYSICAL EXAM Vitals:   04/06/16 1615 04/06/16 2100 04/06/16 2333 04/07/16 0345  BP: (!) 141/86 (!) 141/69 (!) 155/83 (!) 147/59  Pulse: 69 73 72 68  Resp:  18  16  Temp:  97.8 F (36.6 C)  98.4 F (36.9 C)  TempSrc:  Oral  Oral  SpO2:  99%  97%  Weight:      Height:       Well developed and nourished in no acute distress HENT normal Neck supple with JVP-flat Clear Regular rate and rhythm, 2/6 murmur murmurs or gallops Abd-soft with active BS No Clubbing cyanosis edema Skin-warm and dry A & Oriented  Grossly normal sensory and motor function   TELEMETRY: Reviewed personnally pt in NSR:  ECG personally reviewed**    Intake/Output Summary (Last 24 hours) at 04/07/16 0921 Last data filed at 04/06/16 2100  Gross per 24 hour  Intake              240 ml  Output                0 ml  Net              240 ml    LABS: Personally reviewed   Basic Metabolic Panel:  Recent Labs Lab 04/05/16 1457 04/06/16 0123 04/06/16 1626 04/07/16 0333  NA 136 135 135 135  K 3.1* 2.9* 3.1* 3.5  CL 94* 92* 95* 99*  CO2 32 31 27 27   GLUCOSE 108* 142* 104* 118*  BUN 18 17 15 19   CREATININE 0.97 1.17* 1.23* 1.12*  CALCIUM 10.1 9.5 9.6 9.1   Cardiac Enzymes: No results for input(s): CKTOTAL, CKMB, CKMBINDEX, TROPONINI in the last 72 hours. CBC:  Recent Labs Lab 04/05/16 1457 04/06/16 0123  WBC 8.4 8.3  NEUTROABS 5.2  --   HGB 13.7 12.0  HCT 40.9 36.7  MCV 82.0 82.3  PLT 343 323   PROTIME:  Recent Labs  04/06/16 0123  LABPROT 13.9  INR 1.07   Liver Function Tests:  Recent Labs  04/05/16 1457 04/06/16 0123  AST 22 22  ALT 17 16  ALKPHOS 79 74  BILITOT 0.6 0.5  PROT 8.8* 7.4  ALBUMIN 4.5 3.8    Recent Labs  04/05/16 1457  LIPASE 18   BNP: BNP (last 3 results)  Recent Labs  03/18/16 1135  BNP 307.0*      ASSESSMENT AND PLAN:  Active Problems:   Palpitations   History of abdominal aortic aneurysm (AAA)   Hypokalemia   Hypertensive heart failure (HCC)  Hypokalemia better BP better  Echo Pending  Final recommendations pending echo Continue telemetry for palps    Signed, Sherryl MangesSteven Xiomar Crompton MD  04/07/2016

## 2016-04-08 ENCOUNTER — Encounter (HOSPITAL_COMMUNITY): Payer: Self-pay | Admitting: Certified Registered Nurse Anesthetist

## 2016-04-08 ENCOUNTER — Inpatient Hospital Stay (HOSPITAL_COMMUNITY): Payer: Medicare Other

## 2016-04-08 DIAGNOSIS — I714 Abdominal aortic aneurysm, without rupture, unspecified: Secondary | ICD-10-CM

## 2016-04-08 DIAGNOSIS — I161 Hypertensive emergency: Secondary | ICD-10-CM

## 2016-04-08 DIAGNOSIS — Z01810 Encounter for preprocedural cardiovascular examination: Secondary | ICD-10-CM

## 2016-04-08 LAB — ECHOCARDIOGRAM COMPLETE
Height: 66 in
Weight: 2427.2 oz

## 2016-04-08 LAB — PREPARE RBC (CROSSMATCH)

## 2016-04-08 LAB — ABO/RH: ABO/RH(D): A POS

## 2016-04-08 MED ORDER — LOSARTAN POTASSIUM 50 MG PO TABS
75.0000 mg | ORAL_TABLET | Freq: Every day | ORAL | Status: DC
  Administered 2016-04-09 – 2016-04-10 (×2): 75 mg via ORAL
  Filled 2016-04-08 (×2): qty 1

## 2016-04-08 MED ORDER — LOSARTAN POTASSIUM 25 MG PO TABS
25.0000 mg | ORAL_TABLET | Freq: Once | ORAL | Status: AC
  Administered 2016-04-08: 25 mg via ORAL
  Filled 2016-04-08: qty 1

## 2016-04-08 MED ORDER — SODIUM CHLORIDE 0.9 % IV SOLN: Freq: Once | INTRAVENOUS | Status: DC

## 2016-04-08 MED ORDER — DEXTROSE 5 % IV SOLN: 1.5000 g | INTRAVENOUS | Status: DC

## 2016-04-08 NOTE — Progress Notes (Addendum)
[facility] [place]  Subjective  - doing OK worried about her BP changes.  No abdominal or lumbar pain.   Objective (!) 173/82 72 98.4 F (36.9 C) (Oral) 20 98%  Intake/Output Summary (Last 24 hours) at 04/08/16 0730 Last data filed at 04/07/16 1300  Gross per 24 hour  Intake              480 ml  Output                0 ml  Net              480 ml    Palpable DP 3+ Abdomin with palpable aortic pulse  Assessment/Planning: 76 y.o. female with uncontrolled HTN and 8cm AAA Hospital Day 2  Pending cardiac clearance, echo pending Plan open AAA repair tomorrow 04/09/2016 by Dr. Edilia Boickson Appreciate cardiology help NPO past MN   Royse CityOLLINS, [facility] Hospital[facility] Center[facility]EN 04/08/2016 7:30 AM --  Laboratory Lab Results:  Recent Labs  04/06/16 0123 04/07/16 0333  WBC 8.3 7.6  HGB 12.0 11.0*  HCT 36.7 34.3*  PLT 323 281   BMET  Recent Labs  04/06/16 1626 04/07/16 0333  NA 135 135  K 3.1* 3.5  CL 95* 99*  CO2 27 27  GLUCOSE 104* 118*  BUN 15 19  CREATININE 1.23* 1.12*  CALCIUM 9.6 9.1    COAG Lab Results  Component Value Date   INR 1.07 04/06/2016    I have interviewed the patient and examined the patient. I agree with the findings by the PA. Plan open repair of AAA if cleared by Cardiology.  Cari Carawayhris Keagon Glascoe, MD [phone]

## 2016-04-08 NOTE — Progress Notes (Signed)
Progress Note  Patient Name: Taylor Long Date of Encounter: 04/08/2016  Primary Cardiologist:  new  Subjective   Pt pending echocardiogram results to evaluation left ventricular function, cardiology has been asked to see for perioperative evaluation by vascular surgery for an  8 cm abdominal aortic aneurysm.  She has uncontrolled hypertension and her potassium is aggressively being repleated before OR (3.5 today).   Blood pressure has been poorly controlled over the past 24 hours. Mild abdominal pain otherwise no other complaints, currently eating lunch.  Inpatient Medications    Scheduled Meds: . aspirin EC  81 mg Oral Daily  . atorvastatin  10 mg Oral q1800  . [START ON 04/09/2016] cefUROXime (ZINACEF)  IV  1.5 g Intravenous On Call to OR  . enoxaparin (LOVENOX) injection  40 mg Subcutaneous Q24H  . losartan  50 mg Oral Daily  . metoprolol tartrate  50 mg Oral Q8H  . multivitamin with minerals  1 tablet Oral Daily  . pantoprazole  40 mg Oral Daily  . protein supplement shake  11 oz Oral Q24H   Continuous Infusions: . sodium chloride 75 mL/hr at 04/05/16 1840  . dextrose 5 % and 0.45 % NaCl with KCl 20 mEq/L 50 mL/hr at 04/07/16 1614   PRN Meds: alum & mag hydroxide-simeth, guaiFENesin-dextromethorphan, ibuprofen, LORazepam, metoprolol, ondansetron, ondansetron, phenol   Vital Signs    Vitals:   04/07/16 2332 04/08/16 0500 04/08/16 0502 04/08/16 0646  BP: (!) 146/96 (!) 171/75 (!) 184/82 (!) 173/82  Pulse: 74 72    Resp:  20    Temp:  98.4 F (36.9 C)    TempSrc:  Oral    SpO2:  98%    Weight:      Height:        Intake/Output Summary (Last 24 hours) at 04/08/16 1123 Last data filed at 04/07/16 1300  Gross per 24 hour  Intake              240 ml  Output                0 ml  Net              240 ml   Filed Weights   04/05/16 1450 04/06/16 0020  Weight: 153 lb (69.4 kg) 151 lb 11.2 oz (68.8 kg)    Telemetry    Sinus rhythm with multiple PVC's -  Personally Reviewed  ECG    None since yesterday  - Personally Reviewed  Physical Exam   GEN: No acute distress.   Neck: No JVD Cardiac: RRR, no murmurs, rubs, or gallops.  Respiratory: Clear to auscultation bilaterally. GI: Soft, nontender, non-distended  MS: No edema; No deformity. Neuro:  Nonfocal  Psych: Normal affect   Labs    Chemistry Recent Labs Lab 04/05/16 1457 04/06/16 0123 04/06/16 1626 04/07/16 0333  NA 136 135 135 135  K 3.1* 2.9* 3.1* 3.5  CL 94* 92* 95* 99*  CO2 32 31 27 27   GLUCOSE 108* 142* 104* 118*  BUN 18 17 15 19   CREATININE 0.97 1.17* 1.23* 1.12*  CALCIUM 10.1 9.5 9.6 9.1  PROT 8.8* 7.4  --   --   ALBUMIN 4.5 3.8  --   --   AST 22 22  --   --   ALT 17 16  --   --   ALKPHOS 79 74  --   --   BILITOT 0.6 0.5  --   --  GFRNONAA 57* 45* 42* 48*  GFRAA >60 52* 49* 55*  ANIONGAP 10 12 13 9      Hematology Recent Labs Lab 04/05/16 1457 04/06/16 0123 04/07/16 0333  WBC 8.4 8.3 7.6  RBC 4.99 4.46 4.12  HGB 13.7 12.0 11.0*  HCT 40.9 36.7 34.3*  MCV 82.0 82.3 83.3  MCH 27.5 26.9 26.7  MCHC 33.5 32.7 32.1  RDW 13.1 13.1 13.4  PLT 343 323 281     Radiology   CT Chest/abd/pelv dissection eval. 04/05/16   IMPRESSION: Although there are no specific findings of acute aortic syndrome, there is an infrarenal abdominal aortic aneurysm measuring 8 cm, and the aortic wall appears somewhat thickened/irregular, potentially inflammatory/reactive. Vascular consultation is recommended.  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 The left kidney measures 8.1 cm compared to the right measuring 11.7 cm. Although the chronicity uncertain, findings may represent acute renal artery thrombosis superimposed on chronic stenosis, and could represent a source of left-sided abdominal pain.  Aortic atherosclerosis with severe mural soft plaque throughout the length of the thoracic aorta and abdominal aorta including significant  mural thrombus/ plaque within the aneurysm sac.  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  Nodule of the lingula measuring 5 mm and nodule of the left lower lobe measuring 4 mm. Given the patient risk factors, CT follow-up in 6 months is recommended, as suggested by the updated Fleischner Society guidelines.  Diverticular disease without evidence of acute diverticulitis  Cardiac Studies   Transthoracic Echocardiogram pending for today.  Patient Profile     76 y.o. female with a 8 cm abdominal aortic aneurysm, uncontrolled hypertension, exercise intolerance, diabetes, daily headaches. She saw her PCP on 04/05/16 and was sent for an US. Th eUS showed a 8 cm AAA, and was sent to the ED for further evaluation.  She has been having gradual onset of worsening and persistent abd pain for the past several months.   Assessment & Plan    Large abdominal aortic aneurysm- 8 cm Vascular planning to do surgical repair pending cardiac clearance. Cardiac echo is pending- to evaluate left ventricular function, will need to follow closely for results.   Hypertension 24 hours; Min 138/65 and Max 184/96---Poorly controlled on current medication regimen. -  Currently on;  Daily Losartan 50 mg PO daily and Metoprolol 50 mg q 8 hour, increase Losartan to 75 mg daily.. Given extra 25 mg Losartan today. PRN Hydralazine 5 mg IV q 20 minutes prn elevated BP  Diabetes: Controlled- admitting to manage.   Suan HalterSigned, Sylva Overley G, PA-C  04/08/2016, 11:23 AM

## 2016-04-08 NOTE — Progress Notes (Signed)
  2D Echocardiogram has been performed.  Taylor Long 04/08/2016, 11:53 AM

## 2016-04-09 ENCOUNTER — Encounter (HOSPITAL_COMMUNITY)
Admission: EM | Disposition: A | Discharge status: Home Health | Payer: Self-pay | Source: Home / Self Care | Attending: Vascular Surgery

## 2016-04-09 ENCOUNTER — Encounter (HOSPITAL_COMMUNITY): Payer: Self-pay | Admitting: Cardiology

## 2016-04-09 DIAGNOSIS — Z01810 Encounter for preprocedural cardiovascular examination: Secondary | ICD-10-CM

## 2016-04-09 DIAGNOSIS — I208 Other forms of angina pectoris: Secondary | ICD-10-CM

## 2016-04-09 DIAGNOSIS — I25118 Atherosclerotic heart disease of native coronary artery with other forms of angina pectoris: Secondary | ICD-10-CM

## 2016-04-09 HISTORY — PX: LEFT HEART CATH AND CORONARY ANGIOGRAPHY: CATH118249

## 2016-04-09 LAB — BASIC METABOLIC PANEL
Anion gap: 8 (ref 5–15)
BUN: 21 mg/dL — ABNORMAL HIGH (ref 6–20)
CHLORIDE: 102 mmol/L (ref 101–111)
CO2: 28 mmol/L (ref 22–32)
CREATININE: 1.07 mg/dL — AB (ref 0.44–1.00)
Calcium: 8.9 mg/dL (ref 8.9–10.3)
GFR calc Af Amer: 58 mL/min — ABNORMAL LOW (ref >60)
GFR calc non Af Amer: 50 mL/min — ABNORMAL LOW (ref >60)
GLUCOSE: 119 mg/dL — AB (ref 65–99)
Potassium: 3.9 mmol/L (ref 3.5–5.1)
Sodium: 138 mmol/L (ref 135–145)

## 2016-04-09 LAB — CBC
HEMATOCRIT: 34.3 % — AB (ref 36.0–46.0)
Hemoglobin: 10.7 g/dL — ABNORMAL LOW (ref 12.0–15.0)
MCH: 26.2 pg (ref 26.0–34.0)
MCHC: 31.2 g/dL (ref 30.0–36.0)
MCV: 84.1 fL (ref 78.0–100.0)
PLATELETS: 266 10*3/uL (ref 150–400)
RBC: 4.08 MIL/uL (ref 3.87–5.11)
RDW: 13.6 % (ref 11.5–15.5)
WBC: 8.3 10*3/uL (ref 4.0–10.5)

## 2016-04-09 LAB — ALDOSTERONE + RENIN ACTIVITY W/ RATIO
ALDO / PRA RATIO: 1.9 (ref 0.0–30.0)
Aldosterone: 22.5 ng/dL (ref 0.0–30.0)
PRA LC/MS/MS: 12.082 ng/mL/h — AB (ref 0.167–5.380)

## 2016-04-09 LAB — PROTIME-INR
INR: 1.03
Prothrombin Time: 13.6 seconds (ref 11.4–15.2)

## 2016-04-09 SURGERY — LEFT HEART CATH AND CORONARY ANGIOGRAPHY

## 2016-04-09 MED ORDER — HEPARIN SODIUM (PORCINE) 1000 UNIT/ML IJ SOLN
INTRAMUSCULAR | Status: DC | PRN
  Administered 2016-04-09: 3500 [IU] via INTRAVENOUS

## 2016-04-09 MED ORDER — AMLODIPINE BESYLATE 5 MG PO TABS
5.0000 mg | ORAL_TABLET | Freq: Every day | ORAL | Status: DC
  Administered 2016-04-09 – 2016-04-10 (×2): 5 mg via ORAL
  Filled 2016-04-09 (×2): qty 1

## 2016-04-09 MED ORDER — VERAPAMIL HCL 2.5 MG/ML IV SOLN
INTRAVENOUS | Status: DC | PRN
  Administered 2016-04-09: 10 mL via INTRA_ARTERIAL

## 2016-04-09 MED ORDER — LIDOCAINE HCL (PF) 1 % IJ SOLN
INTRAMUSCULAR | Status: AC
  Filled 2016-04-09: qty 30

## 2016-04-09 MED ORDER — IOPAMIDOL (ISOVUE-370) INJECTION 76%
INTRAVENOUS | Status: AC
  Filled 2016-04-09: qty 100

## 2016-04-09 MED ORDER — IOPAMIDOL (ISOVUE-370) INJECTION 76%
INTRAVENOUS | Status: AC
  Filled 2016-04-09: qty 50

## 2016-04-09 MED ORDER — HYDRALAZINE HCL 20 MG/ML IJ SOLN
10.0000 mg | INTRAMUSCULAR | Status: DC | PRN
Start: 2016-04-09 — End: 2016-04-09

## 2016-04-09 MED ORDER — LIDOCAINE HCL (PF) 1 % IJ SOLN
INTRAMUSCULAR | Status: DC | PRN
  Administered 2016-04-09: 3 mL via INTRADERMAL

## 2016-04-09 MED ORDER — SODIUM CHLORIDE 0.9% FLUSH
3.0000 mL | Freq: Two times a day (BID) | INTRAVENOUS | Status: DC
  Administered 2016-04-09 – 2016-04-11 (×5): 3 mL via INTRAVENOUS

## 2016-04-09 MED ORDER — HYDRALAZINE HCL 20 MG/ML IJ SOLN
10.0000 mg | INTRAMUSCULAR | Status: AC | PRN
  Administered 2016-04-09 – 2016-04-11 (×3): 10 mg via INTRAVENOUS
  Filled 2016-04-09 (×3): qty 1

## 2016-04-09 MED ORDER — MIDAZOLAM HCL 2 MG/2ML IJ SOLN
INTRAMUSCULAR | Status: AC
  Filled 2016-04-09: qty 2

## 2016-04-09 MED ORDER — ENALAPRILAT 1.25 MG/ML IV SOLN
INTRAVENOUS | Status: AC
  Filled 2016-04-09: qty 1

## 2016-04-09 MED ORDER — SODIUM CHLORIDE 0.9 % IV SOLN: INTRAVENOUS | Status: DC

## 2016-04-09 MED ORDER — FENTANYL CITRATE (PF) 100 MCG/2ML IJ SOLN
INTRAMUSCULAR | Status: AC
Start: 2016-04-09 — End: 2016-04-09
  Filled 2016-04-09: qty 2

## 2016-04-09 MED ORDER — ACETAMINOPHEN 325 MG PO TABS
650.0000 mg | ORAL_TABLET | Freq: Four times a day (QID) | ORAL | Status: DC | PRN
  Administered 2016-04-10: 650 mg via ORAL
  Filled 2016-04-09: qty 2

## 2016-04-09 MED ORDER — SODIUM CHLORIDE 0.9% FLUSH: 3.0000 mL | INTRAVENOUS | Status: DC | PRN

## 2016-04-09 MED ORDER — SODIUM CHLORIDE 0.9 % IV SOLN
INTRAVENOUS | Status: AC
  Administered 2016-04-09: 15:00:00 via INTRAVENOUS

## 2016-04-09 MED ORDER — SODIUM CHLORIDE 0.9% FLUSH: 3.0000 mL | Freq: Two times a day (BID) | INTRAVENOUS | Status: DC

## 2016-04-09 MED ORDER — IOPAMIDOL (ISOVUE-370) INJECTION 76%
INTRAVENOUS | Status: DC | PRN
  Administered 2016-04-09: 115 mL via INTRA_ARTERIAL

## 2016-04-09 MED ORDER — HEPARIN (PORCINE) IN NACL 2-0.9 UNIT/ML-% IJ SOLN
INTRAMUSCULAR | Status: DC | PRN
  Administered 2016-04-09: 1000 mL via INTRA_ARTERIAL

## 2016-04-09 MED ORDER — MIDAZOLAM HCL 2 MG/2ML IJ SOLN
INTRAMUSCULAR | Status: DC | PRN
  Administered 2016-04-09: 1 mg via INTRAVENOUS

## 2016-04-09 MED ORDER — SODIUM CHLORIDE 0.9 % IV SOLN: 250.0000 mL | INTRAVENOUS | Status: DC | PRN

## 2016-04-09 MED ORDER — HEPARIN SODIUM (PORCINE) 1000 UNIT/ML IJ SOLN
INTRAMUSCULAR | Status: AC
Start: 2016-04-09 — End: 2016-04-09
  Filled 2016-04-09: qty 1

## 2016-04-09 MED ORDER — VERAPAMIL HCL 2.5 MG/ML IV SOLN
INTRAVENOUS | Status: AC
  Filled 2016-04-09: qty 2

## 2016-04-09 MED ORDER — FENTANYL CITRATE (PF) 100 MCG/2ML IJ SOLN
INTRAMUSCULAR | Status: DC | PRN
  Administered 2016-04-09: 25 ug via INTRAVENOUS

## 2016-04-09 MED ORDER — HYDRALAZINE HCL 20 MG/ML IJ SOLN
10.0000 mg | INTRAMUSCULAR | Status: DC | PRN
  Administered 2016-04-09: 10 mg via INTRAVENOUS
  Filled 2016-04-09: qty 1

## 2016-04-09 MED ORDER — HEPARIN (PORCINE) IN NACL 2-0.9 UNIT/ML-% IJ SOLN
INTRAMUSCULAR | Status: AC
  Filled 2016-04-09: qty 1000

## 2016-04-09 SURGICAL SUPPLY — 10 items
CATH EXPO 5FR ANG PIGTAIL 145 (CATHETERS) ×3 IMPLANT
CATH OPTITORQUE TIG 4.0 5F (CATHETERS) ×3 IMPLANT
DEVICE RAD COMP TR BAND LRG (VASCULAR PRODUCTS) ×3 IMPLANT
GLIDESHEATH SLEND A-KIT 6F 22G (SHEATH) ×3 IMPLANT
GUIDEWIRE INQWIRE 1.5J.035X260 (WIRE) ×1 IMPLANT
INQWIRE 1.5J .035X260CM (WIRE) ×3
KIT HEART LEFT (KITS) ×3 IMPLANT
PACK CARDIAC CATHETERIZATION (CUSTOM PROCEDURE TRAY) ×3 IMPLANT
TRANSDUCER W/STOPCOCK (MISCELLANEOUS) ×3 IMPLANT
TUBING CIL FLEX 10 FLL-RA (TUBING) ×3 IMPLANT

## 2016-04-09 NOTE — Progress Notes (Signed)
   VASCULAR SURGERY ASSESSMENT & PLAN:  CORONARY ARTERY DISEASE: Her cardiac catheterization shows an 80% proximal circumflex lesion which would be a difficult PCI target. As per Dr. Elissa HeftyHarding's note, he feels that the best option would be to proceed with repair of her abdominal aortic aneurysm. The circumflex lesion could potentially be addressed in the future.  HYPERTENSION: This continues to be poorly controlled. We'll defer any changes in her medications to cardiology.  8 CM JUXTARENAL ABDOMINAL AORTIC ANEURYSM: I plan repair of her aneurysm on Friday. I have discussed the procedure and risks again with the patient and her son today.  SUBJECTIVE: No specific complaints today.   PHYSICAL EXAM: Vitals:   04/09/16 1445 04/09/16 1500 04/09/16 1515 04/09/16 1529  BP: (!) 160/74 (!) 168/83 (!) 166/73 (!) 168/107  Pulse:      Resp:      Temp:      TempSrc:      SpO2:      Weight:      Height:       Abdomen Is nontender.   LABS: Lab Results  Component Value Date   WBC 8.3 04/09/2016   HGB 10.7 (L) 04/09/2016   HCT 34.3 (L) 04/09/2016   MCV 84.1 04/09/2016   PLT 266 04/09/2016   Lab Results  Component Value Date   CREATININE 1.07 (H) 04/09/2016   Lab Results  Component Value Date   INR 1.03 04/09/2016   CBG (las   Active Problems:   Palpitations   History of abdominal aortic aneurysm (AAA)   Hypokalemia   Hypertensive heart failure (HCC)   Abdominal aortic aneurysm (AAA) without rupture [facility](HCC)   Hypertensive emergency   Exertional angina [facility](HCC)    Cari CarawayChris Latesha Chesney Beeper: [phone]: [phone] 04/09/2016

## 2016-04-09 NOTE — Progress Notes (Signed)
Was paged regarding pt's BP. Has been running high this admission, currently 170s/180s systolic per nurse. Reviewed cath result with Dr. Herbie BaltimoreHarding. He had to give her 20mg  IV hydralazine in the lab which she responded well to. Per our discussion, continue BB at current dose (HR 60s), continue losartan, add hydralazine 10mg  q1hr PRN high BP, and add amlodipine 5mg  daily. Anticipate she'll need further titration of amlodipine but will start with this regimen. Wrote care order to page cardiology if BP remains >150 after 9pm at which time she may need another 5mg  of amlodipine. Yaritsa Savarino PA-C

## 2016-04-09 NOTE — Progress Notes (Signed)
  DAILY PROGRESS NOTE  Subjective:  No events overnight. No chest pain. Anxious about cath today.  Objective:  Temp:  [97.7 F (36.5 C)-98.8 F (37.1 C)] 97.7 F (36.5 C) (03/13 0424) Pulse Rate:  [66-75] 66 (03/13 0424) Resp:  [20-21] 20 (03/13 0424) BP: (148-187)/(78-81) 187/81 (03/13 0424) SpO2:  [100 %] 100 % (03/13 0424) Weight:  [157 lb 4.8 oz (71.4 kg)] 157 lb 4.8 oz (71.4 kg) (03/13 0424) Weight change:   Intake/Output from previous day: 03/12 0701 - 03/13 0700 In: 1560 [P.O.:960; I.V.:600] Out: -   Intake/Output from this shift: No intake/output data recorded.  Medications: No current facility-administered medications on file prior to encounter.    Current Outpatient Prescriptions on File Prior to Encounter  Medication Sig Dispense Refill  . hydrochlorothiazide (HYDRODIURIL) 25 MG tablet Take 25 mg by mouth daily.     . ibuprofen (ADVIL,MOTRIN) 200 MG tablet Take 200 mg by mouth every 6 (six) hours as needed for moderate pain.    . LORazepam (ATIVAN) 0.5 MG tablet Take 0.25-0.5 mg by mouth 2 (two) times daily as needed for anxiety.    . losartan (COZAAR) 50 MG tablet Take 1 tablet by mouth daily.    . metoprolol succinate (TOPROL-XL) 25 MG 24 hr tablet Take 1 tablet by mouth daily.    . metoprolol (LOPRESSOR) 50 MG tablet Take 1 tablet (50 mg total) by mouth 2 (two) times daily. Take 25 mg (1/2 tablet) in the morning and 50mg in the evening. (Patient not taking: Reported on 04/05/2016) 60 tablet 0  . ondansetron (ZOFRAN) 4 MG tablet Take 1 tablet (4 mg total) by mouth every 8 (eight) hours as needed for nausea or vomiting. 12 tablet 0    Physical Exam: General appearance: alert and no distress Lungs: clear to auscultation bilaterally Heart: regular rate and rhythm, S1, S2 normal, no murmur, click, rub or gallop Extremities: extremities normal, atraumatic, no cyanosis or edema Neurologic: Grossly normal  Lab Results: Results for orders placed or performed  during the hospital encounter of 04/05/16 (from the past 48 hour(s))  ABO/Rh     Status: None   Collection Time: 04/08/16 11:47 AM  Result Value Ref Range   ABO/RH(D) A POS   Prepare RBC     Status: None   Collection Time: 04/08/16 11:47 AM  Result Value Ref Range   Order Confirmation ORDER PROCESSED BY BLOOD BANK   Type and screen [facility]     Status: None (Preliminary result)   Collection Time: 04/08/16 11:47 AM  Result Value Ref Range   ABO/RH(D) A POS    Antibody Screen NEG    Sample Expiration 04/11/2016    Unit Number [id number]    Blood Component Type RED CELLS,LR    Unit division 00    Status of Unit ALLOCATED    Transfusion Status OK TO TRANSFUSE    Crossmatch Result Compatible    Unit Number [id number]    Blood Component Type RED CELLS,LR    Unit division 00    Status of Unit ALLOCATED    Transfusion Status OK TO TRANSFUSE    Crossmatch Result Compatible   Protime-INR     Status: None   Collection Time: 04/09/16  2:19 AM  Result Value Ref Range   Prothrombin Time 13.6 11.4 - 15.2 seconds   INR 1.03   Basic metabolic panel     Status: Abnormal   Collection Time: 04/09/16  2:19 AM  Result Value   Ref Range   Sodium 138 135 - 145 mmol/L   Potassium 3.9 3.5 - 5.1 mmol/L   Chloride 102 101 - 111 mmol/L   CO2 28 22 - 32 mmol/L   Glucose, Bld 119 (H) 65 - 99 mg/dL   BUN 21 (H) 6 - 20 mg/dL   Creatinine, Ser 1.07 (H) 0.44 - 1.00 mg/dL   Calcium 8.9 8.9 - 10.3 mg/dL   GFR calc non Af Amer 50 (L) >60 mL/min   GFR calc Af Amer 58 (L) >60 mL/min    Comment: (NOTE) The eGFR has been calculated using the CKD EPI equation. This calculation has not been validated in all clinical situations. eGFR's persistently <60 mL/min signify possible Chronic Kidney Disease.    Anion gap 8 5 - 15  CBC     Status: Abnormal   Collection Time: 04/09/16  2:19 AM  Result Value Ref Range   WBC 8.3 4.0 - 10.5 K/uL   RBC 4.08 3.87 - 5.11 MIL/uL   Hemoglobin 10.7  (L) 12.0 - 15.0 g/dL   HCT 34.3 (L) 36.0 - 46.0 %   MCV 84.1 78.0 - 100.0 fL   MCH 26.2 26.0 - 34.0 pg   MCHC 31.2 30.0 - 36.0 g/dL   RDW 13.6 11.5 - 15.5 %   Platelets 266 150 - 400 K/uL    Imaging: No results found.  Assessment:  1. Active Problems: 2.   Palpitations 3.   History of abdominal aortic aneurysm (AAA) 4.   Hypokalemia 5.   Hypertensive heart failure (HCC) 6.   Abdominal aortic aneurysm (AAA) without rupture (HCC) 7.   Hypertensive emergency 8.   Plan:  1. Plan for LHC today - not likely to intervene. If there is multivessel disease, would need CABG evaluation and this would precede aneurysm repair. Will need to discuss single vessel disease findings in the context of large AAA - may then recommend proceeding with surgery if the degree of stenosis is not severe. She is not deemed a stent graft candidate, therefore, would not commit to DAPT with a coronary stent.  Time Spent Directly with Patient:  15 minutes  Length of Stay:  LOS: 4 days   Kenneth C. Hilty, MD, FACC Attending Cardiologist [facility]  Kenneth C Hilty 04/09/2016, 11:31 AM    

## 2016-04-09 NOTE — Care Management Important Message (Signed)
Important Message  Patient Details  Name: Camillia Herterlaine T Adriano MRN: [id number] Date of Birth: 12/16/1940   Medicare Important Message Given:  Yes    Kyla BalzarineShealy, Robbi Spells Abena 04/09/2016, 12:35 PM

## 2016-04-10 DIAGNOSIS — I2510 Atherosclerotic heart disease of native coronary artery without angina pectoris: Secondary | ICD-10-CM

## 2016-04-10 DIAGNOSIS — N183 Chronic kidney disease, stage 3 unspecified: Secondary | ICD-10-CM

## 2016-04-10 DIAGNOSIS — Z9889 Other specified postprocedural states: Secondary | ICD-10-CM

## 2016-04-10 LAB — LIPID PANEL
CHOL/HDL RATIO: 2.8 ratio
CHOLESTEROL: 124 mg/dL (ref 0–200)
HDL: 45 mg/dL (ref >40)
LDL CALC: 63 mg/dL (ref 0–99)
TRIGLYCERIDES: 78 mg/dL (ref <150)
VLDL: 16 mg/dL (ref 0–40)

## 2016-04-10 MED ORDER — ONDANSETRON HCL 4 MG/2ML IJ SOLN
4.0000 mg | Freq: Four times a day (QID) | INTRAMUSCULAR | Status: DC
  Filled 2016-04-10: qty 2

## 2016-04-10 MED ORDER — AMLODIPINE BESYLATE 10 MG PO TABS
10.0000 mg | ORAL_TABLET | Freq: Every day | ORAL | Status: DC
  Administered 2016-04-11: 10 mg via ORAL
  Filled 2016-04-10: qty 1

## 2016-04-10 MED ORDER — LOSARTAN POTASSIUM 50 MG PO TABS
100.0000 mg | ORAL_TABLET | Freq: Every day | ORAL | Status: DC
  Administered 2016-04-11: 100 mg via ORAL
  Filled 2016-04-10: qty 2

## 2016-04-10 MED ORDER — ATORVASTATIN CALCIUM 80 MG PO TABS
80.0000 mg | ORAL_TABLET | Freq: Every day | ORAL | Status: DC
Start: 2016-04-10 — End: 2016-04-12
  Administered 2016-04-10 – 2016-04-11 (×2): 80 mg via ORAL
  Filled 2016-04-10 (×2): qty 1

## 2016-04-10 MED ORDER — AMLODIPINE BESYLATE 5 MG PO TABS
5.0000 mg | ORAL_TABLET | Freq: Once | ORAL | Status: DC
  Filled 2016-04-10: qty 1

## 2016-04-10 MED ORDER — LOSARTAN POTASSIUM 25 MG PO TABS
25.0000 mg | ORAL_TABLET | Freq: Once | ORAL | Status: DC
  Filled 2016-04-10: qty 1

## 2016-04-10 NOTE — Care Management Note (Signed)
Case Management Note Donn PieriniKristi Mariah Harn RN, BSN Unit 2W-Case Manager [phone]  Patient Details  Name: Taylor Long MRN: [id number] Date of Birth: 12/05/1940  Subjective/Objective:  Pt admitted with 8 cm abdominal aortic aneurysm- plan for OR on 3/16 for repair                  Action/Plan: PTA pt lived at home independent- CM to follow post op for d/c needs  Expected Discharge Date:                  Expected Discharge Plan:  Home w Home Health Services  In-House Referral:     Discharge planning Services  CM Consult  Post Acute Care Choice:    Choice offered to:     DME Arranged:    DME Agency:     HH Arranged:    HH Agency:     Status of Service:  In process, will continue to follow  If discussed at Long Length of Stay Meetings, dates discussed:    Additional Comments:  Darrold SpanWebster, Marva Hendryx Hall, RN 04/10/2016, 10:13 AM

## 2016-04-10 NOTE — Progress Notes (Signed)
Dr Santiago Gladarncelli was paged at 23:56 and call return now. He is aware of High B.P. And patient cond. And todays events. Cont. To monitor and treat B.P. With Hydralzine

## 2016-04-10 NOTE — Progress Notes (Signed)
Progress Note  Patient Name: Taylor Long Date of Encounter: 04/10/2016  Primary Cardiologist: new  Subjective    Patients blood pressure continues to be difficult to control. 191/86 at 4 am this morning but responded well to 10 mg IV Hydralazine. Current daily regimen Metoprolol 50 mg TID (HR 60s), Losartan 75 mg daily and Norvasc 5 mg daily.   Cardiac catheterization done yesterday (04/09/2016) revealing severe single vessel disease; a. Prox Cx lesion, 80% stenosed- focal napkin ring calcified lesion b. Ost LM to Prox LAD- dense moderate calcification  c. Prox RCA lesion, 25% stenosed- diffusely calcified  d. LVEF 55-65%    E. LV end diastolic pressure normal.    Inpatient Medications    Scheduled Meds: . sodium chloride   Intravenous Once  . amLODipine  5 mg Oral Daily  . aspirin EC  81 mg Oral Daily  . atorvastatin  10 mg Oral q1800  . losartan  75 mg Oral Daily  . metoprolol tartrate  50 mg Oral Q8H  . multivitamin with minerals  1 tablet Oral Daily  . ondansetron (ZOFRAN) IV  4 mg Intravenous Q6H  . protein supplement shake  11 oz Oral Q24H  . sodium chloride flush  3 mL Intravenous Q12H   Continuous Infusions: . sodium chloride 75 mL/hr at 04/10/16 0800   PRN Meds: sodium chloride, acetaminophen, hydrALAZINE, sodium chloride flush   Vital Signs    Vitals:   04/09/16 2326 04/10/16 0032 04/10/16 0358 04/10/16 0518  BP: (!) 172/70 (!) 163/63 (!) 191/86 (!) 130/47  Pulse: 78 83 81 73  Resp:   19   Temp:   98.2 F (36.8 C)   TempSrc:   Oral   SpO2: 100%  98%   Weight:      Height:        Intake/Output Summary (Last 24 hours) at 04/10/16 1103 Last data filed at 04/10/16 0800  Gross per 24 hour  Intake             8680 ml  Output              500 ml  Net             8180 ml   Filed Weights   04/05/16 1450 04/06/16 0020 04/09/16 0424  Weight: 153 lb (69.4 kg) 151 lb 11.2 oz (68.8 kg) 157 lb 4.8 oz (71.4 kg)    Telemetry    HR 70's, NSR with  Intermittent PVC's- Personally Reviewed  ECG    None since 04/07/16 - Personally Reviewed  Physical Exam   GEN: No acute distress.   Neck: No JVD Cardiac: 2/6 murmur murmurs or gallops Respiratory: Clear to auscultation bilaterally. GI: Soft, nontender, non-distended  MS: No edema; No deformity. Neuro:  Nonfocal  Psych: Normal affect   Labs    Chemistry  Recent Labs Lab 04/05/16 1457 04/06/16 0123 04/06/16 1626 04/07/16 0333 04/09/16 0219  NA 136 135 135 135 138  K 3.1* 2.9* 3.1* 3.5 3.9  CL 94* 92* 95* 99* 102  CO2 32 31 27 27 28   GLUCOSE 108* 142* 104* 118* 119*  BUN 18 17 15 19  21*  CREATININE 0.97 1.17* 1.23* 1.12* 1.07*  CALCIUM 10.1 9.5 9.6 9.1 8.9  PROT 8.8* 7.4  --   --   --   ALBUMIN 4.5 3.8  --   --   --   AST 22 22  --   --   --   ALT  17 16  --   --   --   ALKPHOS 79 74  --   --   --   BILITOT 0.6 0.5  --   --   --   GFRNONAA 57* 45* 42* 48* 50*  GFRAA >60 52* 49* 55* 58*  ANIONGAP 10 12 13 9 8      Hematology  Recent Labs Lab 04/06/16 0123 04/07/16 0333 04/09/16 0219  WBC 8.3 7.6 8.3  RBC 4.46 4.12 4.08  HGB 12.0 11.0* 10.7*  HCT 36.7 34.3* 34.3*  MCV 82.3 83.3 84.1  MCH 26.9 26.7 26.2  MCHC 32.7 32.1 31.2  RDW 13.1 13.4 13.6  PLT 323 281 266      Radiology    CT Chest/abd/pelv dissection eval. 04/05/16   IMPRESSION: Although there are no specific findings of acute aortic syndrome, there is an infrarenal abdominal aortic aneurysm measuring 8 cm, and the aortic wall appears somewhat thickened/irregular, potentially inflammatory/reactive. Vascular consultation is recommended.  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 The left kidney measures 8.1 cm compared to the right measuring 11.7 cm. Although the chronicity uncertain, findings may represent acute renal artery thrombosis superimposed on chronic stenosis, and could represent a source of left-sided abdominal pain.  Aortic atherosclerosis with  severe mural soft plaque throughout the length of the thoracic aorta and abdominal aorta including significant mural thrombus/ plaque within the aneurysm sac.  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  Nodule of the lingula measuring 5 mm and nodule of the left lower lobe measuring 4 mm. Given the patient risk factors, CT follow-up in 6 months is recommended, as suggested by the updated Fleischner Society guidelines.  Diverticular disease without evidence of acute diverticulitis  Cardiac Studies   Left Heart Cath and Coronary Angiography 04/09/2016   Prox Cx lesion, 80 %stenosed. - Focal napkin ring calcified lesion. Would be difficult PCI target  Ost LM to Prox LAD - dense moderate calcification  Prox RCA lesion, 25 %stenosed - diffusely calcified  The left ventricular ejection fraction is 55-65% by visual estimate.  LV end diastolic pressure is normal.   Severe single vessel disease involving the proximal circumflex napkin ring lesion. This would be a very difficult target for PCI due to poor visualization, and calcification. Would be very difficult to advance equipment based on the angulation of the circumflex complex takeoff.  Recommendations:  At this point, I think the best option would be to proceed with abdominal aortic aneurysm surgery or to any difficult high-risk PCI the circumflex lesion.  This would preclude the need for antiplatelet agent being held for surgery.  Would continue to titrate anti-hypertensive regimen and antianginals.    Can address the circumflex lesion in the future if not controlled with medical management. Diagnostic Diagram      Transthoracic Echocardiography 04/08/2016  Study Conclusions  - Left ventricle: The cavity size was normal. Wall thickness was   increased in a pattern of mild LVH. Systolic function was mildly    reduced. The estimated ejection fraction was in the range of 45%   to 50%. Doppler parameters are consistent with abnormal left   ventricular relaxation (grade 1 diastolic dysfunction). The E/e&'   ratio is between 8-15, suggesting indeterminate LV filling   pressure. - Left atrium: The atrium was normal in size. - Atrial septum: There was increased thickness of the septum,   consistent  with lipomatous hypertrophy. - Inferior vena cava: The vessel was normal in size. The   respirophasic diameter changes were in the normal range (>= 50%),   consistent with normal central venous pressure.  Impressions:  - LVEF 45-50%, mild LVH, basal to mid inferior hypokinesis,   diastolic dysfunction, indeterminate LV filling pressure, normal   LA size, normal IVC.  Patient Profile     76 y.o. female with a 8 cm abdominal aortic aneurysm, uncontrolled hypertension, exercise intolerance, diabetes, daily headaches. She saw her PCP on 04/05/16 and was sent for an [place]. Th eUS showed a 8 cm AAA, and was sent to the ED for further evaluation.  She has been having gradual onset of worsening and persistent abd pain for the past several months.  Assessment & Plan   Active Problems: 1.  Severe single vessel coronary artery disease; Prox Cx lesion, 80% stenosed- focal napkin ring calcified lesion 2. Coronary artery disease 3.  Palpitations 4.   Hypokalemia 5.   Hypertensive heart failure (HCC) 6.   Abdominal aortic aneurysm (AAA) without rupture (HCC) 7.   Hypertensive emergency 8.   Possible CKD III  Plan:  AAA: The recommendation to to proceed with AAA surgery prior to intervention to difficult high risk PCI to the circumflex lesion, as it would preclude the need for antiplatelet agent being held for surgery. Vascular anticipates surgery date 04/12/16 pending better control of Blood Pressure.  CAD: Continue aggressive medical therapy for coronary artery disease. Continue Aspirin, optimize BP and   antianginal therapy. Last lipid panel in 2010, recheck levels. Increase Lipitor to 80 mg daily.  Hypertension: Patient not well controlled on current BP medication regimen (Metoprolol 50 mg TID (HR 60s), Losartan 75 mg daily and Norvasc 5 mg daily). She has been requiring multiple doses of IV Hydralazine to which she responds well to.  - Increase Losartan to 100 mg daily and Norvasc to 10 mg daily   Signed, Dorthula Matas, PA-C  04/10/2016, 11:03 AM

## 2016-04-10 NOTE — Progress Notes (Signed)
   VASCULAR SURGERY ASSESSMENT & PLAN:  CORONARY ARTERY DISEASE: Her cardiac catheterization shows an 80% proximal circumflex lesion which would be a difficult PCI target. As per Dr. Elissa HeftyHarding's note, he feels that the best option would be to proceed with repair of her abdominal aortic aneurysm. The circumflex lesion could potentially be addressed in the future.  HYPERTENSION: This continues to be poorly controlled. I appreciate Cardiology's help with this.   8 CM JUXTARENAL ABDOMINAL AORTIC ANEURYSM: I plan repair of her aneurysm on Friday if BP under better control. I have discussed the procedure and risks again with the patient and her son yesterday.  SUBJECTIVE: C/O headache. Also C/O nausea. No abd or back pain  PHYSICAL EXAM: Vitals:   04/09/16 2326 04/10/16 0032 04/10/16 0358 04/10/16 0518  BP: (!) 172/70 (!) 163/63 (!) 191/86 (!) 130/47  Pulse: 78 83 81 73  Resp:   19   Temp:   98.2 F (36.8 C)   TempSrc:   Oral   SpO2: 100%  98%   Weight:      Height:       Abd: non-tender Lungs clear.   LABS: Lab Results  Component Value Date   WBC 8.3 04/09/2016   HGB 10.7 (L) 04/09/2016   HCT 34.3 (L) 04/09/2016   MCV 84.1 04/09/2016   PLT 266 04/09/2016   Lab Results  Component Value Date   CREATININE 1.07 (H) 04/09/2016   Lab Results  Component Value Date   INR 1.03 04/09/2016   Active Problems:   Palpitations   History of abdominal aortic aneurysm (AAA)   Hypokalemia   Hypertensive heart failure (HCC)   Abdominal aortic aneurysm (AAA) without rupture [facility](HCC)   Hypertensive emergency   Exertional angina [facility](HCC)  Cari CarawayChris Tenae Graziosi Beeper: [phone]: [phone] 04/10/2016

## 2016-04-11 DIAGNOSIS — N183 Chronic kidney disease, stage 3 (moderate): Secondary | ICD-10-CM

## 2016-04-11 DIAGNOSIS — I208 Other forms of angina pectoris: Secondary | ICD-10-CM

## 2016-04-11 LAB — GLUCOSE, CAPILLARY: Glucose-Capillary: 98 mg/dL (ref 65–99)

## 2016-04-11 LAB — BPAM RBC
BLOOD PRODUCT EXPIRATION DATE: 201803282359
Blood Product Expiration Date: 201803282359
UNIT TYPE AND RH: 6200
Unit Type and Rh: 6200

## 2016-04-11 LAB — TYPE AND SCREEN
ABO/RH(D): A POS
ANTIBODY SCREEN: NEGATIVE
Unit division: 0
Unit division: 0

## 2016-04-11 LAB — PREPARE RBC (CROSSMATCH)

## 2016-04-11 MED ORDER — AMLODIPINE BESYLATE 5 MG PO TABS: 5.0000 mg | ORAL_TABLET | ORAL | Status: DC

## 2016-04-11 MED ORDER — ONDANSETRON HCL 4 MG/2ML IJ SOLN
4.0000 mg | Freq: Four times a day (QID) | INTRAMUSCULAR | Status: DC | PRN
  Administered 2016-04-12: 4 mg via INTRAVENOUS

## 2016-04-11 MED ORDER — DEXTROSE 5 % IV SOLN
1.5000 g | INTRAVENOUS | Status: AC
  Administered 2016-04-12: 1.5 g via INTRAVENOUS
  Filled 2016-04-11 (×2): qty 1.5

## 2016-04-11 MED ORDER — SODIUM CHLORIDE 0.9 % IV SOLN
Freq: Once | INTRAVENOUS | Status: AC
  Administered 2016-04-12: 10:00:00 via INTRAVENOUS

## 2016-04-11 NOTE — Progress Notes (Signed)
Progress Note  Patient Name: Taylor Long Date of Encounter: 04/11/2016  Primary Cardiologist: new  Subjective   Patients blood pressure has been ranging from 153-75  to 177/99, required a dose of IV Hydralazine this morning at 5am. Patient is feeling well today, no complaints or questions.   Cardiac catheterization done  (04/09/2016) revealing severe single vessel disease; a. Prox Cx lesion, 80% stenosed- focal napkin ring calcified lesion b. Ost LM to Prox LAD- dense moderate calcification  c. Prox RCA lesion, 25% stenosed- diffusely calcified  d. LVEF 55-65%    E. LV end diastolic pressure normal.    Inpatient Medications    Scheduled Meds: . sodium chloride   Intravenous Once  . amLODipine  10 mg Oral Daily  . aspirin EC  81 mg Oral Daily  . atorvastatin  80 mg Oral q1800  . [START ON 04/12/2016] cefUROXime (ZINACEF)  IV  1.5 g Intravenous On Call to OR  . losartan  100 mg Oral Daily  . metoprolol tartrate  50 mg Oral Q8H  . multivitamin with minerals  1 tablet Oral Daily  . protein supplement shake  11 oz Oral Q24H  . sodium chloride flush  3 mL Intravenous Q12H   Continuous Infusions: . sodium chloride 75 mL/hr at 04/11/16 0549   PRN Meds: sodium chloride, acetaminophen, ondansetron (ZOFRAN) IV, sodium chloride flush   Vital Signs    Vitals:   04/11/16 0157 04/11/16 0520 04/11/16 0629 04/11/16 1041  BP: (!) 155/64 (!) 177/80 (!) 145/63 (!) 177/88  Pulse: 70 75 83 81  Resp: 20 20    Temp:  99.1 F (37.3 C)    TempSrc:  Oral    SpO2: 97% 98% 97%   Weight:      Height:        Intake/Output Summary (Last 24 hours) at 04/11/16 1219 Last data filed at 04/10/16 1800  Gross per 24 hour  Intake              720 ml  Output              400 ml  Net              320 ml   Filed Weights   04/05/16 1450 04/06/16 0020 04/09/16 0424  Weight: 153 lb (69.4 kg) 151 lb 11.2 oz (68.8 kg) 157 lb 4.8 oz (71.4 kg)    Telemetry    HR 80's, NSR with Intermittent PVC's-  Personally Reviewed  ECG    None since 04/07/2016 - Personally Reviewed  Physical Exam   GEN: No acute distress.   Neck: No JVD Cardiac: RRR, no murmurs, rubs, or gallops.  Respiratory: Clear to auscultation bilaterally. GI: Soft, nontender, non-distended  MS: No edema; No deformity. Neuro:  Nonfocal  Psych: Normal affect   Labs    Chemistry Recent Labs Lab 04/05/16 1457 04/06/16 0123 04/06/16 1626 04/07/16 0333 04/09/16 0219  NA 136 135 135 135 138  K 3.1* 2.9* 3.1* 3.5 3.9  CL 94* 92* 95* 99* 102  CO2 32 31 27 27 28   GLUCOSE 108* 142* 104* 118* 119*  BUN 18 17 15 19  21*  CREATININE 0.97 1.17* 1.23* 1.12* 1.07*  CALCIUM 10.1 9.5 9.6 9.1 8.9  PROT 8.8* 7.4  --   --   --   ALBUMIN 4.5 3.8  --   --   --   AST 22 22  --   --   --   ALT  17 16  --   --   --   ALKPHOS 79 74  --   --   --   BILITOT 0.6 0.5  --   --   --   GFRNONAA 57* 45* 42* 48* 50*  GFRAA >60 52* 49* 55* 58*  ANIONGAP 10 12 13 9 8      Hematology Recent Labs Lab 04/06/16 0123 04/07/16 0333 04/09/16 0219  WBC 8.3 7.6 8.3  RBC 4.46 4.12 4.08  HGB 12.0 11.0* 10.7*  HCT 36.7 34.3* 34.3*  MCV 82.3 83.3 84.1  MCH 26.9 26.7 26.2  MCHC 32.7 32.1 31.2  RDW 13.1 13.4 13.6  PLT 323 281 266     Radiology    CT Chest/abd/pelv dissection eval. 04/05/16   IMPRESSION: Although there are no specific findings of acute aortic syndrome, there is an infrarenal abdominal aortic aneurysm measuring 8 cm, and the aortic wall appears somewhat thickened/irregular, potentially inflammatory/reactive. Vascular consultation is recommended.  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 The left kidney measures 8.1 cm compared to the right measuring 11.7 cm. Although the chronicity uncertain, findings may represent acute renal artery thrombosis superimposed on chronic stenosis, and could represent a source of left-sided abdominal pain.  Aortic atherosclerosis with severe mural soft  plaque throughout the length of the thoracic aorta and abdominal aorta including significant mural thrombus/ plaque within the aneurysm sac.  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  Nodule of the lingula measuring 5 mm and nodule of the left lower lobe measuring 4 mm. Given the patient risk factors, CT follow-up in 6 months is recommended, as suggested by the updated Fleischner Society guidelines.  Diverticular disease without evidence of acute diverticulitis  Cardiac Studies   Left Heart Cath and Coronary Angiography 04/09/2016   Prox Cx lesion, 80 %stenosed. - Focal napkin ring calcified lesion. Would be difficult PCI target  Ost LM to Prox LAD - dense moderate calcification  Prox RCA lesion, 25 %stenosed - diffusely calcified  The left ventricular ejection fraction is 55-65% by visual estimate.  LV end diastolic pressure is normal.  Severe single vessel disease involving the proximal circumflex napkin ring lesion. This would be a very difficult target for PCI due to poor visualization, and calcification. Would be very difficult to advance equipment based on the angulation of the circumflex complex takeoff.  Recommendations:  At this point, I think the best option would be to proceed with abdominal aortic aneurysm surgery or to any difficult high-risk PCI the circumflex lesion. This would preclude the need for antiplatelet agent being held for surgery.  Would continue to titrate anti-hypertensive regimen and antianginals.   Can address the circumflex lesion in the future if not controlled with medical management. Diagnostic Diagram      Transthoracic Echocardiography 04/08/2016  Study Conclusions  - Left ventricle: The cavity size was normal. Wall thickness was increased in a pattern of mild LVH. Systolic function was mildly reduced. The  estimated ejection fraction was in the range of 45% to 50%. Doppler parameters are consistent with abnormal left ventricular relaxation (grade 1 diastolic dysfunction). The E/e&' ratio is between 8-15, suggesting indeterminate LV filling pressure. - Left atrium: The atrium was normal in size. - Atrial septum: There was increased thickness of the septum, consistent with lipomatous hypertrophy. - Inferior vena cava: The vessel was normal in size. The respirophasic diameter changes were in  the normal range (>= 50%), consistent with normal central venous pressure.  Impressions:  - LVEF 45-50%, mild LVH, basal to mid inferior hypokinesis, diastolic dysfunction, indeterminate LV filling pressure, normal LA size, normal IVC.   Patient Profile     76 y.o.femalewith a 8 cm abdominal aortic aneurysm, uncontrolled hypertension, exercise intolerance, diabetes, daily headaches. She saw her PCP on 04/05/16 and was sent for an [place]. Th eUS showed a 8 cm AAA, and was sent to the ED for further evaluation. She has been having gradual onset of worsening and persistent abd pain for the past several months.  Assessment & Plan    Active Problems: 1. Severe single vessel coronary artery disease; Prox Cx lesion, 80% stenosed- focal napkin ring calcified lesion 2. Coronary artery disease 3.  Palpitations 4. Hypokalemia 5. Hypertensive heart failure (HCC) 6. Abdominal aortic aneurysm (AAA) without rupture (HCC) 7. Hypertensive emergency 8.   Possible CKD III  Plan AAA: The recommendation to to proceed with AAA surgery prior to intervention to difficult high risk PCI to the circumflex lesion, as it would preclude the need for antiplatelet agent being held for surgery. Vascular anticipates surgery date 04/12/16 with Dr. Edilia Bo.  CAD: Continue aggressive medical therapy for coronary artery disease. Continue Aspirin, optimize BP and  antianginal therapy. Continue Lipitor  80  mg daily. Cholesterol     124 HDL cholesterol   45 LDL    63 Triglycerides   16  Hypertension: Metoprolol 50 mg TID (HR 60s), Losartan 75 mg daily Norvasc 5 mg daily.  PRN 10mg  IV Hydralazine  --  She required 10 mg IV dose of Hydralazine at 5 am this morning for BP of 177/80 --  Recommend Increase Losartan to 100 mg daily and Norvasc to 10 mg daily   Signed, Dorthula Matas, PA-C  04/11/2016, 12:19 PM

## 2016-04-11 NOTE — Progress Notes (Signed)
F/u visit per second page to pray with patient.  Upon return to patient room did not want prayer from any strangers. She said she was uncomfortable and had her own prayer support.

## 2016-04-11 NOTE — Progress Notes (Signed)
Visited patient via [place] [facility]. Patient resting.  Made patient nurse aware of my visit.  Chaplain available as needed.

## 2016-04-11 NOTE — Progress Notes (Signed)
Nutrition Follow Up  INTERVENTION:    Continue Premier Protein daily, each supplement provides 160 kcals, 30 gm protein  NUTRITION DIAGNOSIS:   Predicted suboptimal nutrient intake related to poor appetite as evidenced by per patient/family report, mild depletion of muscle mass, resolved   GOAL:   Patient will meet greater than or equal to 90% of their needs, met  MONITOR:   PO intake, Supplement acceptance, Skin, Weight trends, Labs, I & O's  ASSESSMENT:   76 y.o. female with history of HTN, seen by her primary care physician for a routine follow up visit and was found to have a pulsatile abdominal mass. She was sent to the emergency department and CT scan showed an 8 cm aneurysm.   Pt s/p cardiac cath 3/14; revealed severe proximal LCX lesion  Continues on a Heart Healthy diet. PO intake good at 70-100% per flowsheets. Receiving Premier Protein nutrition supplement daily; likes. Medications reviewed and include MVI. Plan for open repair of AAA 3/16.  Diet Order:  Diet Heart Room service appropriate? Yes; Fluid consistency: Thin Diet NPO time specified Except for: Sips with Meds Diet NPO time specified Except for: Sips with Meds  Skin:  Reviewed, no issues  Last BM:  3/14  Height:   Ht Readings from Last 1 Encounters:  04/06/16 _0  (1.676 m)    Weight:   Wt Readings from Last 1 Encounters:  04/09/16 157 lb 4.8 oz (71.4 kg)    Ideal Body Weight:  59.09 kg  BMI:  Body mass index is 25.39 kg/m.  Estimated Nutritional Needs:   Kcal:  1600-1800  Protein:  80-90 grams  Fluid:  1.8 L/day  EDUCATION NEEDS:   No education needs identified at this time  Arthur Holms, RD, LDN Pager #: [phone] After-Hours Pager #: [phone]

## 2016-04-11 NOTE — Progress Notes (Addendum)
[facility] [place]  Subjective  - Doing OK worried about her up coming surgery and her BP.  Per nursing she was hypotensive systolic 105 yesterday and BP meds where held except metoprolol.  She was hypertensive later in the day systolic 177.  She was given Hydralazine and responded well current BP systolic 145.     Objective (!) 145/63 83 99.1 F (37.3 C) (Oral) 20 97%  Intake/Output Summary (Last 24 hours) at 04/11/16 [id number] Last data filed at 04/10/16 1800  Gross per 24 hour  Intake             9160 ml  Output              700 ml  Net             8460 ml   Palpable AAA pulse, abdomin soft Palpable DP pulses 3+ Heart RRR 80 bpm Lungs non labored breathing   Assessment/Planning: 76 y/o femalewith uncontrolled HTN and 8cm AAA  Cath yesterday severe proximal LCX lesion, focal, calcified at a bend. Although significant, not probably prohibitive for aneurysm repair. Plan to go forward with surgery on Friday with Dr. Edilia Boickson.  HTN: on going.  Appreciate cardiology help  Plan for open repair of AAA tomorrow NPO past MN.   [name], EMMA [facility] Center[facility]EN 04/11/2016 7:12 AM --  Laboratory Lab Results:  Recent Labs  04/09/16 0219  WBC 8.3  HGB 10.7*  HCT 34.3*  PLT 266   BMET  Recent Labs  04/09/16 0219  NA 138  K 3.9  CL 102  CO2 28  GLUCOSE 119*  BUN 21*  CREATININE 1.07*  CALCIUM 8.9    COAG Lab Results  Component Value Date   INR 1.03 04/09/2016   INR 1.07 04/06/2016   No results found for: PTT  I have interviewed the patient and examined the patient. I agree with the findings by the PA. For open repair of 8 cm AAA tomorrow AM. Appreciate Cardiology's help.   Cari Carawayhris Dickson, MD [phone]

## 2016-04-12 ENCOUNTER — Encounter (HOSPITAL_COMMUNITY)
Admission: EM | Disposition: A | Discharge status: Home Health | Payer: Self-pay | Source: Home / Self Care | Attending: Vascular Surgery

## 2016-04-12 ENCOUNTER — Inpatient Hospital Stay (HOSPITAL_COMMUNITY): Payer: Medicare Other

## 2016-04-12 ENCOUNTER — Inpatient Hospital Stay (HOSPITAL_COMMUNITY): Payer: Medicare Other | Admitting: Critical Care Medicine

## 2016-04-12 ENCOUNTER — Encounter (HOSPITAL_COMMUNITY): Payer: Self-pay | Admitting: Anesthesiology

## 2016-04-12 ENCOUNTER — Other Ambulatory Visit: Payer: Self-pay

## 2016-04-12 HISTORY — PX: ABDOMINAL AORTIC ANEURYSM REPAIR: SHX42

## 2016-04-12 LAB — PROTIME-INR
INR: 1.04
INR: 1.21
Prothrombin Time: 13.6 seconds (ref 11.4–15.2)
Prothrombin Time: 15.3 seconds — ABNORMAL HIGH (ref 11.4–15.2)

## 2016-04-12 LAB — BASIC METABOLIC PANEL
Anion gap: 7 (ref 5–15)
Anion gap: 5 (ref 5–15)
BUN: 22 mg/dL — AB (ref 6–20)
BUN: 16 mg/dL (ref 6–20)
CALCIUM: 8.3 mg/dL — AB (ref 8.9–10.3)
CHLORIDE: 104 mmol/L (ref 101–111)
CO2: 28 mmol/L (ref 22–32)
CO2: 26 mmol/L (ref 22–32)
CREATININE: 1.01 mg/dL — AB (ref 0.44–1.00)
Calcium: 9.2 mg/dL (ref 8.9–10.3)
Chloride: 108 mmol/L (ref 101–111)
Creatinine, Ser: 1.06 mg/dL — ABNORMAL HIGH (ref 0.44–1.00)
GFR calc Af Amer: 59 mL/min — ABNORMAL LOW (ref >60)
GFR calc Af Amer: >60 mL/min (ref >60)
GFR calc non Af Amer: 54 mL/min — ABNORMAL LOW (ref >60)
GFR, EST NON AFRICAN AMERICAN: 51 mL/min — AB (ref >60)
GLUCOSE: 108 mg/dL — AB (ref 65–99)
Glucose, Bld: 169 mg/dL — ABNORMAL HIGH (ref 65–99)
POTASSIUM: 3.6 mmol/L (ref 3.5–5.1)
Potassium: 4.1 mmol/L (ref 3.5–5.1)
Sodium: 139 mmol/L (ref 135–145)
Sodium: 139 mmol/L (ref 135–145)

## 2016-04-12 LAB — CBC
HEMATOCRIT: 33.6 % — AB (ref 36.0–46.0)
HEMATOCRIT: 32.2 % — AB (ref 36.0–46.0)
Hemoglobin: 10.7 g/dL — ABNORMAL LOW (ref 12.0–15.0)
Hemoglobin: 10.4 g/dL — ABNORMAL LOW (ref 12.0–15.0)
MCH: 26.6 pg (ref 26.0–34.0)
MCH: 26.5 pg (ref 26.0–34.0)
MCHC: 31.8 g/dL (ref 30.0–36.0)
MCHC: 32.3 g/dL (ref 30.0–36.0)
MCV: 83.6 fL (ref 78.0–100.0)
MCV: 82.1 fL (ref 78.0–100.0)
PLATELETS: 204 10*3/uL (ref 150–400)
Platelets: 292 10*3/uL (ref 150–400)
RBC: 4.02 MIL/uL (ref 3.87–5.11)
RBC: 3.92 MIL/uL (ref 3.87–5.11)
RDW: 14.1 % (ref 11.5–15.5)
RDW: 14.1 % (ref 11.5–15.5)
WBC: 8.9 10*3/uL (ref 4.0–10.5)
WBC: 11 10*3/uL — ABNORMAL HIGH (ref 4.0–10.5)

## 2016-04-12 LAB — PREPARE RBC (CROSSMATCH)

## 2016-04-12 LAB — APTT: APTT: 27 s (ref 24–36)

## 2016-04-12 LAB — SURGICAL PCR SCREEN
MRSA, PCR: NEGATIVE
Staphylococcus aureus: NEGATIVE

## 2016-04-12 LAB — MAGNESIUM: Magnesium: 1.7 mg/dL (ref 1.7–2.4)

## 2016-04-12 SURGERY — ANEURYSM ABDOMINAL AORTIC REPAIR
Anesthesia: General | Site: Abdomen

## 2016-04-12 MED ORDER — PHENOL 1.4 % MT LIQD
1.0000 | OROMUCOSAL | Status: DC | PRN
  Administered 2016-04-13: 1 via OROMUCOSAL
  Filled 2016-04-12: qty 177

## 2016-04-12 MED ORDER — SUCCINYLCHOLINE CHLORIDE 20 MG/ML IJ SOLN
INTRAMUSCULAR | Status: DC | PRN
  Administered 2016-04-12: 100 mg via INTRAVENOUS

## 2016-04-12 MED ORDER — SUFENTANIL CITRATE 50 MCG/ML IV SOLN
INTRAVENOUS | Status: AC
  Filled 2016-04-12: qty 1

## 2016-04-12 MED ORDER — SUGAMMADEX SODIUM 200 MG/2ML IV SOLN
INTRAVENOUS | Status: AC
  Filled 2016-04-12: qty 2

## 2016-04-12 MED ORDER — PANTOPRAZOLE SODIUM 40 MG PO TBEC
40.0000 mg | DELAYED_RELEASE_TABLET | Freq: Every day | ORAL | Status: DC
  Administered 2016-04-15 – 2016-04-19 (×5): 40 mg via ORAL
  Filled 2016-04-12 (×5): qty 1

## 2016-04-12 MED ORDER — ORAL CARE MOUTH RINSE: 15.0000 mL | Freq: Two times a day (BID) | OROMUCOSAL | Status: DC

## 2016-04-12 MED ORDER — NITROGLYCERIN IN D5W 200-5 MCG/ML-% IV SOLN
2.0000 ug/min | INTRAVENOUS | Status: DC
  Administered 2016-04-13: 100 ug/min via INTRAVENOUS
  Administered 2016-04-13 (×3): 140 ug/min via INTRAVENOUS
  Administered 2016-04-14: 155 ug/min via INTRAVENOUS
  Administered 2016-04-14: 180 ug/min via INTRAVENOUS
  Administered 2016-04-14 – 2016-04-15 (×3): 160 ug/min via INTRAVENOUS
  Filled 2016-04-12 (×10): qty 250

## 2016-04-12 MED ORDER — OXYMETAZOLINE HCL 0.05 % NA SOLN
NASAL | Status: DC | PRN
  Administered 2016-04-12: 2 via NASAL

## 2016-04-12 MED ORDER — HYDROMORPHONE HCL 1 MG/ML IJ SOLN
0.5000 mg | INTRAMUSCULAR | Status: DC | PRN
  Administered 2016-04-12 – 2016-04-13 (×8): 1 mg via INTRAVENOUS
  Administered 2016-04-14: 0.5 mg via INTRAVENOUS
  Administered 2016-04-14: 1 mg via INTRAVENOUS
  Administered 2016-04-14: 0.5 mg via INTRAVENOUS
  Administered 2016-04-15: 1 mg via INTRAVENOUS
  Filled 2016-04-12 (×12): qty 1

## 2016-04-12 MED ORDER — MAGNESIUM SULFATE 2 GM/50ML IV SOLN
2.0000 g | Freq: Every day | INTRAVENOUS | Status: DC | PRN
  Filled 2016-04-12: qty 50

## 2016-04-12 MED ORDER — ACETAMINOPHEN 325 MG PO TABS
325.0000 mg | ORAL_TABLET | ORAL | Status: DC | PRN
  Administered 2016-04-17: 650 mg via ORAL
  Filled 2016-04-12: qty 2

## 2016-04-12 MED ORDER — METOPROLOL TARTRATE 5 MG/5ML IV SOLN
2.0000 mg | INTRAVENOUS | Status: AC | PRN
  Administered 2016-04-14 (×2): 5 mg via INTRAVENOUS
  Filled 2016-04-12 (×2): qty 5

## 2016-04-12 MED ORDER — PROPOFOL 10 MG/ML IV BOLUS
INTRAVENOUS | Status: AC
  Filled 2016-04-12: qty 20

## 2016-04-12 MED ORDER — HYDRALAZINE HCL 20 MG/ML IJ SOLN
10.0000 mg | Freq: Once | INTRAMUSCULAR | Status: AC
  Administered 2016-04-12: 10 mg via INTRAVENOUS
  Filled 2016-04-12: qty 1

## 2016-04-12 MED ORDER — PROMETHAZINE HCL 25 MG/ML IJ SOLN: 6.2500 mg | INTRAMUSCULAR | Status: DC | PRN

## 2016-04-12 MED ORDER — DEXMEDETOMIDINE HCL IN NACL 200 MCG/50ML IV SOLN
INTRAVENOUS | Status: AC
  Filled 2016-04-12: qty 50

## 2016-04-12 MED ORDER — ENOXAPARIN SODIUM 40 MG/0.4ML ~~LOC~~ SOLN
40.0000 mg | SUBCUTANEOUS | Status: DC
  Administered 2016-04-13 – 2016-04-19 (×7): 40 mg via SUBCUTANEOUS
  Filled 2016-04-12 (×7): qty 0.4

## 2016-04-12 MED ORDER — SODIUM CHLORIDE 0.9 % IV SOLN: Freq: Once | INTRAVENOUS | Status: DC

## 2016-04-12 MED ORDER — LABETALOL HCL 5 MG/ML IV SOLN
10.0000 mg | INTRAVENOUS | Status: AC | PRN
  Administered 2016-04-12 – 2016-04-13 (×4): 10 mg via INTRAVENOUS
  Filled 2016-04-12 (×3): qty 4

## 2016-04-12 MED ORDER — LIDOCAINE HCL (CARDIAC) 20 MG/ML IV SOLN
INTRAVENOUS | Status: DC | PRN
  Administered 2016-04-12: 80 mg via INTRAVENOUS

## 2016-04-12 MED ORDER — GUAIFENESIN-DM 100-10 MG/5ML PO SYRP: 15.0000 mL | ORAL_SOLUTION | ORAL | Status: DC | PRN

## 2016-04-12 MED ORDER — ALUM & MAG HYDROXIDE-SIMETH 200-200-20 MG/5ML PO SUSP: 15.0000 mL | ORAL | Status: DC | PRN

## 2016-04-12 MED ORDER — SUCCINYLCHOLINE CHLORIDE 200 MG/10ML IV SOSY
PREFILLED_SYRINGE | INTRAVENOUS | Status: AC
  Filled 2016-04-12: qty 10

## 2016-04-12 MED ORDER — ARTIFICIAL TEARS OP OINT
TOPICAL_OINTMENT | OPHTHALMIC | Status: AC
  Filled 2016-04-12: qty 3.5

## 2016-04-12 MED ORDER — NITROGLYCERIN IN D5W 200-5 MCG/ML-% IV SOLN
INTRAVENOUS | Status: DC | PRN
  Administered 2016-04-12: 16.6 ug/min via INTRAVENOUS

## 2016-04-12 MED ORDER — FENTANYL CITRATE (PF) 100 MCG/2ML IJ SOLN
25.0000 ug | INTRAMUSCULAR | Status: DC | PRN
  Administered 2016-04-12 (×2): 25 ug via INTRAVENOUS

## 2016-04-12 MED ORDER — ROCURONIUM BROMIDE 50 MG/5ML IV SOSY
PREFILLED_SYRINGE | INTRAVENOUS | Status: AC
  Filled 2016-04-12: qty 5

## 2016-04-12 MED ORDER — ACETAMINOPHEN 325 MG RE SUPP: 325.0000 mg | RECTAL | Status: DC | PRN

## 2016-04-12 MED ORDER — 0.9 % SODIUM CHLORIDE (POUR BTL) OPTIME
TOPICAL | Status: DC | PRN
  Administered 2016-04-12: 1000 mL
  Administered 2016-04-12: 3000 mL

## 2016-04-12 MED ORDER — HEPARIN SODIUM (PORCINE) 1000 UNIT/ML IJ SOLN
INTRAMUSCULAR | Status: DC | PRN
  Administered 2016-04-12: 7000 [IU] via INTRAVENOUS

## 2016-04-12 MED ORDER — CHLORHEXIDINE GLUCONATE 0.12 % MT SOLN: 15.0000 mL | Freq: Two times a day (BID) | OROMUCOSAL | Status: DC

## 2016-04-12 MED ORDER — LIDOCAINE 2% (20 MG/ML) 5 ML SYRINGE
INTRAMUSCULAR | Status: AC
  Filled 2016-04-12: qty 5

## 2016-04-12 MED ORDER — ONDANSETRON HCL 4 MG/2ML IJ SOLN
INTRAMUSCULAR | Status: AC
  Filled 2016-04-12: qty 2

## 2016-04-12 MED ORDER — MIDAZOLAM HCL 2 MG/2ML IJ SOLN
INTRAMUSCULAR | Status: AC
  Filled 2016-04-12: qty 2

## 2016-04-12 MED ORDER — SODIUM CHLORIDE 0.9 % IV SOLN: 500.0000 mL | Freq: Once | INTRAVENOUS | Status: DC | PRN

## 2016-04-12 MED ORDER — LACTATED RINGERS IV SOLN
INTRAVENOUS | Status: DC | PRN
  Administered 2016-04-12 (×2): via INTRAVENOUS

## 2016-04-12 MED ORDER — DOCUSATE SODIUM 100 MG PO CAPS
100.0000 mg | ORAL_CAPSULE | Freq: Every day | ORAL | Status: DC
  Administered 2016-04-15 – 2016-04-19 (×4): 100 mg via ORAL
  Filled 2016-04-12 (×4): qty 1

## 2016-04-12 MED ORDER — SODIUM CHLORIDE 0.9 % IV SOLN
INTRAVENOUS | Status: DC | PRN
  Administered 2016-04-12: 07:00:00

## 2016-04-12 MED ORDER — FENTANYL CITRATE (PF) 100 MCG/2ML IJ SOLN
INTRAMUSCULAR | Status: AC
  Administered 2016-04-12: 25 ug via INTRAVENOUS
  Filled 2016-04-12: qty 2

## 2016-04-12 MED ORDER — ONDANSETRON HCL 4 MG/2ML IJ SOLN
INTRAMUSCULAR | Status: AC
  Administered 2016-04-12: 4 mg via INTRAVENOUS
  Filled 2016-04-12: qty 2

## 2016-04-12 MED ORDER — DEXTROSE 5 % IV SOLN
1.5000 g | Freq: Two times a day (BID) | INTRAVENOUS | Status: AC
  Administered 2016-04-12 – 2016-04-13 (×2): 1.5 g via INTRAVENOUS
  Filled 2016-04-12 (×2): qty 1.5

## 2016-04-12 MED ORDER — EPINEPHRINE PF 1 MG/10ML IJ SOSY
PREFILLED_SYRINGE | INTRAMUSCULAR | Status: AC
  Filled 2016-04-12: qty 10

## 2016-04-12 MED ORDER — DEXMEDETOMIDINE HCL IN NACL 200 MCG/50ML IV SOLN
INTRAVENOUS | Status: DC | PRN
  Administered 2016-04-12: .2 ug/kg/h via INTRAVENOUS

## 2016-04-12 MED ORDER — HYDRALAZINE HCL 20 MG/ML IJ SOLN
5.0000 mg | INTRAMUSCULAR | Status: AC | PRN
  Administered 2016-04-14 (×2): 5 mg via INTRAVENOUS
  Filled 2016-04-12 (×2): qty 1

## 2016-04-12 MED ORDER — ONDANSETRON HCL 4 MG/2ML IJ SOLN
INTRAMUSCULAR | Status: DC | PRN
  Administered 2016-04-12: 4 mg via INTRAVENOUS

## 2016-04-12 MED ORDER — SUFENTANIL CITRATE 50 MCG/ML IV SOLN
INTRAVENOUS | Status: DC | PRN
  Administered 2016-04-12 (×3): 5 ug via INTRAVENOUS
  Administered 2016-04-12: 10 ug via INTRAVENOUS
  Administered 2016-04-12 (×7): 5 ug via INTRAVENOUS
  Administered 2016-04-12: 10 ug via INTRAVENOUS
  Administered 2016-04-12 (×3): 5 ug via INTRAVENOUS

## 2016-04-12 MED ORDER — ONDANSETRON HCL 4 MG/2ML IJ SOLN
4.0000 mg | Freq: Four times a day (QID) | INTRAMUSCULAR | Status: DC | PRN
  Administered 2016-04-13 – 2016-04-17 (×3): 4 mg via INTRAVENOUS
  Filled 2016-04-12 (×4): qty 2

## 2016-04-12 MED ORDER — LABETALOL HCL 5 MG/ML IV SOLN
INTRAVENOUS | Status: DC | PRN
  Administered 2016-04-12: 5 mg via INTRAVENOUS
  Administered 2016-04-12 (×4): 2.5 mg via INTRAVENOUS
  Administered 2016-04-12: 5 mg via INTRAVENOUS
  Administered 2016-04-12 (×2): 2.5 mg via INTRAVENOUS

## 2016-04-12 MED ORDER — HEPARIN SODIUM (PORCINE) 1000 UNIT/ML IJ SOLN
INTRAMUSCULAR | Status: AC
  Filled 2016-04-12: qty 1

## 2016-04-12 MED ORDER — LABETALOL HCL 5 MG/ML IV SOLN
INTRAVENOUS | Status: AC
  Filled 2016-04-12: qty 4

## 2016-04-12 MED ORDER — POTASSIUM CHLORIDE CRYS ER 20 MEQ PO TBCR: 20.0000 meq | EXTENDED_RELEASE_TABLET | Freq: Every day | ORAL | Status: DC | PRN

## 2016-04-12 MED ORDER — ROCURONIUM BROMIDE 100 MG/10ML IV SOLN
INTRAVENOUS | Status: DC | PRN
  Administered 2016-04-12: 20 mg via INTRAVENOUS
  Administered 2016-04-12: 10 mg via INTRAVENOUS
  Administered 2016-04-12: 50 mg via INTRAVENOUS
  Administered 2016-04-12: 20 mg via INTRAVENOUS

## 2016-04-12 MED ORDER — PROTAMINE SULFATE 10 MG/ML IV SOLN
INTRAVENOUS | Status: AC
  Filled 2016-04-12: qty 5

## 2016-04-12 MED ORDER — SODIUM CHLORIDE 0.9 % IJ SOLN
INTRAMUSCULAR | Status: AC
  Filled 2016-04-12: qty 30

## 2016-04-12 MED ORDER — SUGAMMADEX SODIUM 200 MG/2ML IV SOLN
INTRAVENOUS | Status: DC | PRN
  Administered 2016-04-12: 140 mg via INTRAVENOUS

## 2016-04-12 MED ORDER — ESMOLOL HCL 100 MG/10ML IV SOLN
INTRAVENOUS | Status: AC
  Filled 2016-04-12: qty 10

## 2016-04-12 MED ORDER — BISACODYL 10 MG RE SUPP: 10.0000 mg | Freq: Every day | RECTAL | Status: DC | PRN

## 2016-04-12 MED ORDER — KCL IN DEXTROSE-NACL 20-5-0.45 MEQ/L-%-% IV SOLN
INTRAVENOUS | Status: DC
  Administered 2016-04-12 – 2016-04-14 (×7): via INTRAVENOUS
  Administered 2016-04-15: 75 mL/h via INTRAVENOUS
  Administered 2016-04-15: 08:00:00 via INTRAVENOUS
  Administered 2016-04-16: 75 mL/h via INTRAVENOUS
  Administered 2016-04-17: 01:00:00 via INTRAVENOUS
  Filled 2016-04-12 (×13): qty 1000

## 2016-04-12 MED ORDER — MANNITOL 25 % IV SOLN
INTRAVENOUS | Status: DC | PRN
  Administered 2016-04-12 (×2): 12.5 g via INTRAVENOUS

## 2016-04-12 MED ORDER — ALBUMIN HUMAN 5 % IV SOLN
INTRAVENOUS | Status: DC | PRN
  Administered 2016-04-12 (×2): via INTRAVENOUS

## 2016-04-12 MED ORDER — MIDAZOLAM HCL 5 MG/5ML IJ SOLN
INTRAMUSCULAR | Status: DC | PRN
  Administered 2016-04-12 (×2): 1 mg via INTRAVENOUS

## 2016-04-12 MED ORDER — HEMOSTATIC AGENTS (NO CHARGE) OPTIME
TOPICAL | Status: DC | PRN
  Administered 2016-04-12: 1 via TOPICAL

## 2016-04-12 MED ORDER — ESMOLOL HCL 100 MG/10ML IV SOLN
INTRAVENOUS | Status: DC | PRN
  Administered 2016-04-12: 30 mg via INTRAVENOUS
  Administered 2016-04-12 (×2): 20 mg via INTRAVENOUS
  Administered 2016-04-12: 10 mg via INTRAVENOUS
  Administered 2016-04-12 (×2): 20 mg via INTRAVENOUS
  Administered 2016-04-12: 10 mg via INTRAVENOUS
  Administered 2016-04-12: 20 mg via INTRAVENOUS
  Administered 2016-04-12: 10 mg via INTRAVENOUS

## 2016-04-12 MED ORDER — PROTAMINE SULFATE 10 MG/ML IV SOLN
INTRAVENOUS | Status: DC | PRN
  Administered 2016-04-12: 50 mg via INTRAVENOUS

## 2016-04-12 MED ORDER — PROPOFOL 10 MG/ML IV BOLUS
INTRAVENOUS | Status: DC | PRN
  Administered 2016-04-12: 100 mg via INTRAVENOUS

## 2016-04-12 MED FILL — Sodium Chloride IV Soln 0.9%: INTRAVENOUS | Qty: 2000 | Status: AC

## 2016-04-12 MED FILL — Heparin Sodium (Porcine) Inj 1000 Unit/ML: INTRAMUSCULAR | Qty: 30 | Status: AC

## 2016-04-12 SURGICAL SUPPLY — 57 items
Bard PTFE Felt ×2 IMPLANT
CANISTER SUCT 3000ML PPV (MISCELLANEOUS) ×3 IMPLANT
CANNULA VESSEL 3MM 2 BLNT TIP (CANNULA) ×6 IMPLANT
CLIP TI MEDIUM 24 (CLIP) ×3 IMPLANT
CLIP TI WIDE RED SMALL 24 (CLIP) ×3 IMPLANT
DERMABOND ADVANCED (GAUZE/BANDAGES/DRESSINGS) ×2
DERMABOND ADVANCED .7 DNX12 (GAUZE/BANDAGES/DRESSINGS) ×1 IMPLANT
ELECT BLADE 4.0 EZ CLEAN MEGAD (MISCELLANEOUS) ×3
ELECT BLADE 6.5 EXT (BLADE) IMPLANT
ELECT REM PT RETURN 9FT ADLT (ELECTROSURGICAL) ×3
ELECTRODE BLDE 4.0 EZ CLN MEGD (MISCELLANEOUS) ×1 IMPLANT
ELECTRODE REM PT RTRN 9FT ADLT (ELECTROSURGICAL) ×1 IMPLANT
FELT TEFLON 1X6 (MISCELLANEOUS) ×3 IMPLANT
FELT TEFLON 4 X1 (Mesh General) IMPLANT
GLOVE BIOGEL PI IND STRL 6.5 (GLOVE) ×4 IMPLANT
GLOVE BIOGEL PI IND STRL 7.0 (GLOVE) ×3 IMPLANT
GLOVE BIOGEL PI IND STRL 7.5 (GLOVE) ×1 IMPLANT
GLOVE BIOGEL PI IND STRL 8 (GLOVE) ×1 IMPLANT
GLOVE BIOGEL PI INDICATOR 6.5 (GLOVE) ×8
GLOVE BIOGEL PI INDICATOR 7.0 (GLOVE) ×6
GLOVE BIOGEL PI INDICATOR 7.5 (GLOVE) ×2
GLOVE BIOGEL PI INDICATOR 8 (GLOVE) ×2
GLOVE SURG SS PI 6.0 STRL IVOR (GLOVE) ×6 IMPLANT
GLOVE SURG SS PI 6.5 STRL IVOR (GLOVE) ×3 IMPLANT
GLOVE SURG SS PI 7.0 STRL IVOR (GLOVE) ×3 IMPLANT
GLOVE SURG SS PI 7.5 STRL IVOR (GLOVE) ×6 IMPLANT
GOWN STRL NON-REIN LRG LVL3 (GOWN DISPOSABLE) ×6 IMPLANT
GOWN STRL REUS W/ TWL LRG LVL3 (GOWN DISPOSABLE) ×5 IMPLANT
GOWN STRL REUS W/TWL LRG LVL3 (GOWN DISPOSABLE) ×10
GRAFT HEMASHIELD 16MM (Vascular Products) ×3 IMPLANT
INSERT FOGARTY 61MM (MISCELLANEOUS) ×6 IMPLANT
INSERT FOGARTY SM (MISCELLANEOUS) ×12 IMPLANT
INSERT FOGARTY XLG (MISCELLANEOUS) ×3 IMPLANT
KIT BASIN OR (CUSTOM PROCEDURE TRAY) ×3 IMPLANT
KIT ROOM TURNOVER OR (KITS) ×3 IMPLANT
NS IRRIG 1000ML POUR BTL (IV SOLUTION) ×6 IMPLANT
PACK AORTA (CUSTOM PROCEDURE TRAY) ×3 IMPLANT
PAD ARMBOARD 7.5X6 YLW CONV (MISCELLANEOUS) ×6 IMPLANT
POWDER SURGICEL 3.0 GRAM (HEMOSTASIS) ×3 IMPLANT
RETAINER VISCERA MED (MISCELLANEOUS) ×3 IMPLANT
SPONGE SURGIFOAM ABS GEL 100 (HEMOSTASIS) IMPLANT
STAPLER VISISTAT (STAPLE) ×6 IMPLANT
SUT ETHIBOND 5 LR DA (SUTURE) IMPLANT
SUT PDS AB 1 TP1 54 (SUTURE) ×6 IMPLANT
SUT PROLENE 3 0 SH 48 (SUTURE) ×18 IMPLANT
SUT PROLENE 3 0 SH DA (SUTURE) IMPLANT
SUT PROLENE 5 0 C 1 24 (SUTURE) IMPLANT
SUT PROLENE 5 0 C 1 36 (SUTURE) ×9 IMPLANT
SUT PROLENE 6 0 BV (SUTURE) ×3 IMPLANT
SUT SILK 2 0 SH CR/8 (SUTURE) ×3 IMPLANT
SUT VIC AB 2-0 CTB1 (SUTURE) ×9 IMPLANT
SUT VIC AB 3-0 SH 27 (SUTURE)
SUT VIC AB 3-0 SH 27X BRD (SUTURE) IMPLANT
SUT VICRYL 4-0 PS2 18IN ABS (SUTURE) ×6 IMPLANT
TOWEL BLUE STERILE X RAY DET (MISCELLANEOUS) ×6 IMPLANT
TRAY FOLEY W/METER SILVER 16FR (SET/KITS/TRAYS/PACK) ×3 IMPLANT
WATER STERILE IRR 1000ML POUR (IV SOLUTION) ×6 IMPLANT

## 2016-04-12 NOTE — Anesthesia Preprocedure Evaluation (Addendum)
Anesthesia Evaluation  Patient identified by MRN, date of birth, ID band Patient awake    Reviewed: Allergy & Precautions, NPO status , Patient's Chart, lab work & pertinent test results  Airway Mallampati: II  TM Distance: >3 FB Neck ROM: Full    Dental no notable dental hx. (+) Teeth Intact, Edentulous Upper, Dental Advisory Given   Pulmonary neg pulmonary ROS, former smoker,    Pulmonary exam normal breath sounds clear to auscultation       Cardiovascular hypertension, + CAD, + Peripheral Vascular Disease and +CHF  Normal cardiovascular exam Rhythm:Regular Rate:Normal  Uncontrolled HTN   Neuro/Psych negative neurological ROS  negative psych ROS   GI/Hepatic negative GI ROS, Neg liver ROS,   Endo/Other  diabetes  Renal/GU Renal InsufficiencyRenal disease  negative genitourinary   Musculoskeletal negative musculoskeletal ROS (+)   Abdominal   Peds negative pediatric ROS (+)  Hematology negative hematology ROS (+)   Anesthesia Other Findings   Reproductive/Obstetrics negative OB ROS                           Anesthesia Physical Anesthesia Plan  ASA: IV  Anesthesia Plan: General   Post-op Pain Management:    Induction: Intravenous  Airway Management Planned: Oral ETT  Additional Equipment: Arterial line and CVP  Intra-op Plan:   Post-operative Plan: Possible Post-op intubation/ventilation  Informed Consent: I have reviewed the patients History and Physical, chart, labs and discussed the procedure including the risks, benefits and alternatives for the proposed anesthesia with the patient or authorized representative who has indicated his/her understanding and acceptance.   Dental advisory given  Plan Discussed with: CRNA and Surgeon  Anesthesia Plan Comments:        Anesthesia Quick Evaluation

## 2016-04-12 NOTE — Anesthesia Postprocedure Evaluation (Signed)
Anesthesia Post Note  Patient: Camillia Herterlaine T Krach  Procedure(s) Performed: Procedure(s) (LRB): ANEURYSM ABDOMINAL AORTIC REPAIR (N/A)  Patient location during evaluation: PACU Anesthesia Type: General Level of consciousness: awake and alert Pain management: pain level controlled Vital Signs Assessment: post-procedure vital signs reviewed and stable Respiratory status: spontaneous breathing, nonlabored ventilation, respiratory function stable and patient connected to nasal cannula oxygen Cardiovascular status: blood pressure returned to baseline and stable Postop Assessment: no signs of nausea or vomiting Anesthetic complications: no       Last Vitals:  Vitals:   04/12/16 1245 04/12/16 1300  BP: 139/66 (!) 141/65  Pulse: 77 76  Resp: 18 18  Temp:      Last Pain:  Vitals:   04/12/16 0526  TempSrc: Oral  PainSc:                  Cherylyn Sundby S

## 2016-04-12 NOTE — Progress Notes (Signed)
Pt BP 189/92. MD notified. Verbal order given with read back to administer one time dose hydralazine 10mg  IV. Will continue to monitor. Gregor HamsAlisha Hovanes Hymas, RN

## 2016-04-12 NOTE — Op Note (Signed)
NAME: Taylor Long   MRN: [id number] DOB: [birth year]    DATE OF OPERATION: 04/12/2016  PREOP DIAGNOSIS: Juxtarenal 8 cm abdominal aortic aneurysm  POSTOP DIAGNOSIS: Juxtarenal, inflammatory, 8 cm, abdominal aortic aneurysm  PROCEDURE:  1. Lysis of adhesions  2. Oversewing of left renal vein  3. Repair of juxtarenal, inflammatory, 8 cm abdominal aortic aneurysm with 16 mm Dacron graft  SURGEON: Di Kindle. Edilia Bo, MD, FACS  ASSIST: Lianne Cure PA  ANESTHESIA: Gen.   EBL: Per anesthesia record.  INDICATIONS: Taylor Long is a 76 y.o. female who was found to have a pulsatile mass on exam. This prompted a CT scan which showed an 8 cm abdominal aortic aneurysm. She was admitted for blood pressure control and underwent preoperative cardiac clearance. She presents for elective repair of her aneurysm. She was not a candidate for an endovascular approach given that the aneurysm extended up to the renal arteries. 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  FINDINGS: The patient had an inflammatory aneurysm and the duodenum was stuck to this. I would not have been able to mobilize the duodenum off of the aneurysm therefore I left the duodenum intact at the inflammatory component.  TECHNIQUE: The patient was taken to the operating room and received general anesthetic. Monitoring lines had been placed by anesthesia. The abdomen was prepped and draped in usual sterile fashion as were both groins and thighs. The abdomen was entered through a midline incision. There were significant adhesions of the omentum to the lateral walls of the abdomen and these were sharply dissected free with electrocautery to allow me to mobilize the greater omentum superiorly. The duodenum was stuck to the inflammatory aneurysm. I tried to mobilize the duodenum off of the aneurysm but this would have been very risky and therefore I left the most attached segment intact. I was able to mobilize the duodenum to  allow exposure of the neck of the aneurysm just below the renal arteries. However the aneurysm extended up to the renal arteries and therefore it was necessary to divide the left renal vein. The left kidney was small with a chronically occluded left renal artery. The renal vein was dissected free and clamped proximal and distally. It was then divided and oversewn at each end with running 5-0 Prolene suture. Clamps were then released. This allowed exposure of the aorta up to level of the renal arteries. The SMA was very close to the level of the renal arteries and was really no place to place a clamp above the renal arteries.   I then dissected the aorta distally. This was challenging because of the inflammation but I was able to expose the proximal common iliac arteries proximally and distally. Because of the limited exposure elected not to get around the arteries circumferentially. The patient was then heparinized and received 25 g of mannitol.  Both common iliac arteries were then clamped. Aneurysm was clamped just below the level of renal arteries and then the inflammatory aneurysm opened leaving the duodenum intact and staying away from this area. Once the aorta was opened several lumbars were oversewn with 2-0 silk sutures  The left posterior wall the proximal aneurysm was left  intact. A 16 mm graft was selected and using a felt cuff, the graft was sewn end to end to the infrarenal aorta just below the   renal arteries. There were a couple of areas with some bleeding when the anastomosis was tested and these were repaired with pledgeted 3-0  Prolene sutures.  The graft was then flushed and then the aorta clamped just above the anastomosis. Distally the graft was cut to the appropriate length. The graft was sewn into and at the aortic bifurcation using running 3-0 Prolene suture. Prior to completing this anastomosis the arteries were backbled and flushed purposely and the anastomosis completed. Flow was  reestablished to the legs and the patient tolerated this from a hemodynamic standpoint. Hemostasis was obtained in the wounds and the wounds were irrigated with copious amount of saline. The inflammatory aneurysm was closed over the graft repair with running 2-0 Vicryl suture. Direct peritoneal tissue was closed with running 2-0 Vicryl suture. The abdominal contents were returned to the normal position and the fascial layer was closed with 2 #1 PDS sutures. The subcutaneous layer was closed with 2 running 3-0 Vicryl's. The skin was closed with 2 4-0 Vicryl. Sterile dressing was applied. Patient tolerated she did well and was transferred to the recovery room in stable condition. All needle and sponge counts were correct. Chr stopher Edilia Boickson, MD, FACS [facility]  DATE OF DICTATION:   04/12/2016

## 2016-04-12 NOTE — H&P (View-Only) (Signed)
[facility] [place]  Subjective  - Doing OK worried about her up coming surgery and her BP.  Per nursing she was hypotensive systolic 105 yesterday and BP meds where held except metoprolol.  She was hypertensive later in the day systolic 177.  She was given Hydralazine and responded well current BP systolic 145.     Objective (!) 145/63 83 99.1 F (37.3 C) (Oral) 20 97%  Intake/Output Summary (Last 24 hours) at 04/11/16 0712 Last data filed at 04/10/16 1800  Gross per 24 hour  Intake             9160 ml  Output              700 ml  Net             8460 ml   Palpable AAA pulse, abdomin soft Palpable DP pulses 3+ Heart RRR 80 bpm Lungs non labored breathing   Assessment/Planning: 75 y/o femalewith uncontrolled HTN and 8cm AAA  Cath yesterday severe proximal LCX lesion, focal, calcified at a bend. Although significant, not probably prohibitive for aneurysm repair. Plan to go forward with surgery on Friday with Dr. Dickson.  HTN: on going.  Appreciate cardiology help  Plan for open repair of AAA tomorrow NPO past MN.   Taylor Long Taylor Long 04/11/2016 7:12 AM --  Laboratory Lab Results:  Recent Labs  04/09/16 0219  WBC 8.3  HGB 10.7*  HCT 34.3*  PLT 266   BMET  Recent Labs  04/09/16 0219  NA 138  K 3.9  CL 102  CO2 28  GLUCOSE 119*  BUN 21*  CREATININE 1.07*  CALCIUM 8.9    COAG Lab Results  Component Value Date   INR 1.03 04/09/2016   INR 1.07 04/06/2016   No results found for: PTT  I have interviewed the patient and examined the patient. I agree with the findings by the PA. For open repair of 8 cm AAA tomorrow AM. Appreciate Cardiology's help.   Chris Dickson, MD [phone]    

## 2016-04-12 NOTE — Transfer of Care (Signed)
Immediate Anesthesia Transfer of Care Note  Patient: Taylor Long  Procedure(s) Performed: Procedure(s): ANEURYSM ABDOMINAL AORTIC REPAIR (N/A)  Patient Location: PACU  Anesthesia Type:General  Level of Consciousness: awake and oriented  Airway & Oxygen Therapy: Patient Spontanous Breathing and Patient connected to nasal cannula oxygen  Post-op Assessment: Report given to RN, Post -op Vital signs reviewed and stable and Patient moving all extremities X 4  Post vital signs: Reviewed and stable  Last Vitals:  Vitals:   04/11/16 2307 04/12/16 0526  BP: (!) 168/77 (!) 189/92  Pulse: 75 79  Resp:  20  Temp:  36.8 C    Last Pain:  Vitals:   04/12/16 0526  TempSrc: Oral  PainSc:       Patients Stated Pain Goal: 0 (04/10/16 0346)  Complications: No apparent anesthesia complications

## 2016-04-12 NOTE — Interval H&P Note (Signed)
History and Physical Interval Note:  04/12/2016 7:12 AM  Taylor HerterElaine T Englert  has presented today for surgery, with the diagnosis of Abdominal aortic aneurysm I71.4  The various methods of treatment have been discussed with the patient and family. After consideration of risks, benefits and other options for treatment, the patient has consented to  Procedure(s): ANEURYSM ABDOMINAL AORTIC REPAIR (N/A) as a surgical intervention .  The patient's history has been reviewed, patient examined, no change in status, stable for surgery.  I have reviewed the patient's chart and labs.  Questions were answered to the patient's satisfaction.     Waverly Ferrariickson, Raed Schalk

## 2016-04-12 NOTE — Anesthesia Procedure Notes (Addendum)
Procedure Name: Intubation Date/Time: 04/12/2016 7:45 AM Performed by: Suzy Bouchard Pre-anesthesia Checklist: Patient identified, Emergency Drugs available, Suction available, Patient being monitored and Timeout performed Patient Re-evaluated:Patient Re-evaluated prior to inductionOxygen Delivery Method: Circle system utilized Preoxygenation: Pre-oxygenation with 100% oxygen Intubation Type: IV induction Ventilation: Mask ventilation without difficulty Laryngoscope Size: Mac and 3 Grade View: Grade II Tube type: Oral Tube size: 7.0 mm Number of attempts: 1 Airway Equipment and Method: Stylet Placement Confirmation: positive ETCO2,  ETT inserted through vocal cords under direct vision and breath sounds checked- equal and bilateral Secured at: 21 cm Tube secured with: Tape Dental Injury: Teeth and Oropharynx as per pre-operative assessment  Comments: E. Delight Stare, [place]

## 2016-04-12 NOTE — Anesthesia Procedure Notes (Signed)
Central Venous Catheter Insertion Performed by: Eilene GhaziOSE, Quince Santana, anesthesiologist Start/End3/16/2018 7:22 AM, 04/12/2016 7:22 AM Patient location: Pre-op. Preanesthetic checklist: patient identified, IV checked, site marked, risks and benefits discussed, surgical consent, monitors and equipment checked, pre-op evaluation, timeout performed and anesthesia consent Position: Trendelenburg Lidocaine 1% used for infiltration and patient sedated Hand hygiene performed , maximum sterile barriers used  and Seldinger technique used Catheter size: 8.5 Fr Total catheter length 10. Central line was placed.Sheath introducer Swan type:thermodilution PA Cath depth:50 Procedure performed using ultrasound guided technique. Ultrasound Notes:anatomy identified, needle tip was noted to be adjacent to the nerve/plexus identified, no ultrasound evidence of intravascular and/or intraneural injection and image(s) printed for medical record Attempts: 1 Following insertion, line sutured and dressing applied. Post procedure assessment: blood return through all ports, free fluid flow and no air  Patient tolerated the procedure well with no immediate complications.

## 2016-04-12 NOTE — Progress Notes (Signed)
   VASCULAR SURGERY POSTOP CHECK:  Stable post op.  BP under good control  Pain adequately controlled.  SUBJECTIVE: Moderate incisional discomfort  PHYSICAL EXAM: Vitals:   04/12/16 1330 04/12/16 1345 04/12/16 1400 04/12/16 1415  BP: (!) 163/69 103/60 108/61 140/65  Pulse: 77 79 75 75  Resp: (!) 22 (!) 21 19 19   Temp: 97.9 F (36.6 C)     TempSrc: Oral     SpO2: 98% 97% 95% 96%  Weight:      Height:       Lungs clear Feet warm   LABS: Lab Results  Component Value Date   WBC 11.0 (H) 04/12/2016   HGB 10.4 (L) 04/12/2016   HCT 32.2 (L) 04/12/2016   MCV 82.1 04/12/2016   PLT 204 04/12/2016   Lab Results  Component Value Date   CREATININE 1.01 (H) 04/12/2016   Lab Results  Component Value Date   INR 1.21 04/12/2016   CBG (last 3)   Recent Labs  04/11/16 1610  GLUCAP 98    Active Problems:   Palpitations   Hypokalemia   Hypertensive heart failure (HCC)   Abdominal aortic aneurysm (AAA) without rupture (HCC)   Hypertensive emergency   Exertional angina (HCC)   S/P cardiac catheterization:  (04/09/2016) a. Prox Cx lesion, 80% sten- focal napkin ring calcified lesion b. Ost LM to Prox LAD- dense mod calc  c. Prox RCA lesion, 25% sten- diffuse calc    CKD (chronic kidney disease), stage III   CAD (coronary artery disease)    Cari Taylor Long Beeper: [phone] 04/12/2016

## 2016-04-13 ENCOUNTER — Inpatient Hospital Stay (HOSPITAL_COMMUNITY): Payer: Medicare Other

## 2016-04-13 LAB — CBC
HCT: 30.6 % — ABNORMAL LOW (ref 36.0–46.0)
Hemoglobin: 9.8 g/dL — ABNORMAL LOW (ref 12.0–15.0)
MCH: 26.6 pg (ref 26.0–34.0)
MCHC: 32 g/dL (ref 30.0–36.0)
MCV: 83.2 fL (ref 78.0–100.0)
Platelets: 203 10*3/uL (ref 150–400)
RBC: 3.68 MIL/uL — AB (ref 3.87–5.11)
RDW: 14.5 % (ref 11.5–15.5)
WBC: 8 10*3/uL (ref 4.0–10.5)

## 2016-04-13 LAB — COMPREHENSIVE METABOLIC PANEL
ALT: 19 U/L (ref 14–54)
AST: 25 U/L (ref 15–41)
Albumin: 3 g/dL — ABNORMAL LOW (ref 3.5–5.0)
Alkaline Phosphatase: 47 U/L (ref 38–126)
Anion gap: 8 (ref 5–15)
BILIRUBIN TOTAL: 0.8 mg/dL (ref 0.3–1.2)
BUN: 17 mg/dL (ref 6–20)
CO2: 24 mmol/L (ref 22–32)
Calcium: 8.5 mg/dL — ABNORMAL LOW (ref 8.9–10.3)
Chloride: 104 mmol/L (ref 101–111)
Creatinine, Ser: 1.22 mg/dL — ABNORMAL HIGH (ref 0.44–1.00)
GFR, EST AFRICAN AMERICAN: 50 mL/min — AB (ref >60)
GFR, EST NON AFRICAN AMERICAN: 43 mL/min — AB (ref >60)
Glucose, Bld: 217 mg/dL — ABNORMAL HIGH (ref 65–99)
POTASSIUM: 4.3 mmol/L (ref 3.5–5.1)
Sodium: 136 mmol/L (ref 135–145)
TOTAL PROTEIN: 5.7 g/dL — AB (ref 6.5–8.1)

## 2016-04-13 LAB — GLUCOSE, CAPILLARY
GLUCOSE-CAPILLARY: 156 mg/dL — AB (ref 65–99)
Glucose-Capillary: 193 mg/dL — ABNORMAL HIGH (ref 65–99)
Glucose-Capillary: 160 mg/dL — ABNORMAL HIGH (ref 65–99)
Glucose-Capillary: 140 mg/dL — ABNORMAL HIGH (ref 65–99)

## 2016-04-13 LAB — POCT I-STAT 7, (LYTES, BLD GAS, ICA,H+H)
ACID-BASE EXCESS: 1 mmol/L (ref 0.0–2.0)
BICARBONATE: 26.6 mmol/L (ref 20.0–28.0)
CALCIUM ION: 1.13 mmol/L — AB (ref 1.15–1.40)
HCT: 23 % — ABNORMAL LOW (ref 36.0–46.0)
Hemoglobin: 7.8 g/dL — ABNORMAL LOW (ref 12.0–15.0)
O2 SAT: 100 %
PH ART: 7.355 (ref 7.350–7.450)
PO2 ART: 190 mmHg — AB (ref 83.0–108.0)
Potassium: 3.7 mmol/L (ref 3.5–5.1)
SODIUM: 138 mmol/L (ref 135–145)
TCO2: 28 mmol/L (ref 0–100)
pCO2 arterial: 47.6 mmHg (ref 32.0–48.0)

## 2016-04-13 LAB — MAGNESIUM: MAGNESIUM: 1.8 mg/dL (ref 1.7–2.4)

## 2016-04-13 LAB — AMYLASE: AMYLASE: 99 U/L (ref 28–100)

## 2016-04-13 MED ORDER — INSULIN ASPART 100 UNIT/ML ~~LOC~~ SOLN
0.0000 [IU] | SUBCUTANEOUS | Status: DC
  Administered 2016-04-13 – 2016-04-14 (×4): 2 [IU] via SUBCUTANEOUS
  Administered 2016-04-14 (×3): 1 [IU] via SUBCUTANEOUS
  Administered 2016-04-14 – 2016-04-15 (×2): 2 [IU] via SUBCUTANEOUS
  Administered 2016-04-15: 1 [IU] via SUBCUTANEOUS
  Administered 2016-04-15 – 2016-04-16 (×2): 2 [IU] via SUBCUTANEOUS
  Administered 2016-04-16: 1 [IU] via SUBCUTANEOUS

## 2016-04-13 MED ORDER — INSULIN ASPART 100 UNIT/ML ~~LOC~~ SOLN
0.0000 [IU] | Freq: Three times a day (TID) | SUBCUTANEOUS | Status: DC
  Administered 2016-04-13: 2 [IU] via SUBCUTANEOUS

## 2016-04-13 MED ORDER — PNEUMOCOCCAL VAC POLYVALENT 25 MCG/0.5ML IJ INJ
0.5000 mL | INJECTION | INTRAMUSCULAR | Status: DC
  Filled 2016-04-13: qty 0.5

## 2016-04-13 NOTE — Progress Notes (Signed)
    Subjective  - POD #1, open AAA repair  Complaining of abdominal pain Remains Hypertensive Denies flatus  Physical Exam:  Abdomen is soft.  Midline incision is intact Palpable pedal pulses Respirations nonlabored       Assessment/Plan:  POD #1  Hypertension: The patient remains on a nitroglycerin drip.  Her A-line is not accurate and we are going by her cuff which shows her pressure is around 160.  We will supplement with IV Lopressor and hydralazine to try to decrease her nitroglycerin drip GI: Moderate NG tube output.  We'll leave then 1 more day.  Remains nothing by mouth GU: Adequate urinary output.  Slight bump in her creatinine to 1.2.  We'll continue with IV fluids for now. Prophylaxis:  Pepcid and lovenox Dispo:  Leave in ICU 1 more day PT/OT  Needs to get OOB today   Pritika Alvarez, Wells 04/13/2016 12:18 PM --  Vitals:   04/13/16 0800 04/13/16 1210  BP:    Pulse:    Resp:    Temp: 99.1 F (37.3 C) 98.6 F (37 C)    Intake/Output Summary (Last 24 hours) at 04/13/16 1218 Last data filed at 04/13/16 0700  Gross per 24 hour  Intake           2739.3 ml  Output              970 ml  Net           1769.3 ml     Laboratory CBC    Component Value Date/Time   WBC 8.0 04/13/2016 0303   HGB 9.8 (L) 04/13/2016 0303   HCT 30.6 (L) 04/13/2016 0303   PLT 203 04/13/2016 0303    BMET    Component Value Date/Time   NA 136 04/13/2016 0303   K 4.3 04/13/2016 0303   CL 104 04/13/2016 0303   CO2 24 04/13/2016 0303   GLUCOSE 217 (H) 04/13/2016 0303   BUN 17 04/13/2016 0303   CREATININE 1.22 (H) 04/13/2016 0303   CALCIUM 8.5 (L) 04/13/2016 0303   GFRNONAA 43 (L) 04/13/2016 0303   GFRAA 50 (L) 04/13/2016 0303    COAG Lab Results  Component Value Date   INR 1.21 04/12/2016   INR 1.04 04/12/2016   INR 1.03 04/09/2016   No results found for: PTT  Antibiotics Anti-infectives    Start     Dose/Rate Route Frequency Ordered Stop   04/12/16 1900   cefUROXime (ZINACEF) 1.5 g in dextrose 5 % 50 mL IVPB     1.5 g 100 mL/hr over 30 Minutes Intravenous Every 12 hours 04/12/16 1311 04/13/16 0630   04/12/16 0700  cefUROXime (ZINACEF) 1.5 g in dextrose 5 % 50 mL IVPB     1.5 g 100 mL/hr over 30 Minutes Intravenous On call to O.R. 04/11/16 [id number] 04/12/16 0747   04/09/16 0600  cefUROXime (ZINACEF) 1.5 g in dextrose 5 % 50 mL IVPB  Status:  Discontinued     1.5 g 100 mL/hr over 30 Minutes Intravenous On call to O.R. 04/08/16 [id number] 04/10/16 2349       V. Charlena CrossWells Raelynne Ludwick IV, M.D. [facility] Office: [phone] Pager:  [phone]

## 2016-04-13 NOTE — Evaluation (Signed)
Physical Therapy Evaluation Patient Details Name: Taylor Long MRN: [id number] DOB: 10/19/1940 Today's Date: 04/13/2016   History of Present Illness  Pt is a 76 y.o. female who presented to ED from PCP on 04/05/16 with abdominal pain. CT showed 8cm AAA. Cardiac cath 3/13 revealed severe single vessel disease. Pt now s/p abdominal aortic aneurysm repair on 3/16. PMH includes HTN.   Clinical Impression  Pt presents to PT with increased pain, generalized weakness, and an overall decrease in functional mobility secondary to above. PTA, pt indep with all functional mobility, riding the bus for transportation, and living alone in senior indep living apt; children nearby and available for pt to live with temporarily if needed upon d/c. Today, pt able to stand and take a few steps to chair; limited by fatigue and c/o dizziness with standing, which subsided with rest. BP up to 176/95 and HR up to 120s with amb. Pt would benefit from continued acute PT services to maximize functional mobility and independence.      Follow Up Recommendations SNF;Supervision for mobility/OOB    Equipment Recommendations   (Defer to next venue)    Recommendations for Other Services OT consult     Precautions / Restrictions Precautions Precautions: Fall Precaution Comments: a line, ngt to suction Restrictions Weight Bearing Restrictions: No      Mobility  Bed Mobility Overal bed mobility: Needs Assistance Bed Mobility: Rolling;Sidelying to Sit Rolling: Min assist Sidelying to sit: HOB elevated;Mod assist       General bed mobility comments: MinA for UE support into rolling. ModA to raise trunk into sitting and scoot hips to EOB.   Transfers Overall transfer level: Needs assistance Equipment used: None Transfers: Sit to/from [employer] CorporationStand;Stand Pivot Transfers Sit to Stand: Mod assist Stand pivot transfers: Mod assist       General transfer comment: Able to stand on 3rd attempt with modA; uncontrolled descent  to chair secondary to dizziness, which subsided with rest.   Ambulation/Gait   Ambulation Distance (Feet): 2 Feet Assistive device: 1 person hand held assist       General Gait Details: Took steps towards chair with HHA and modA for balance.  Stairs            Wheelchair Mobility    Modified Rankin (Stroke Patients Only)       Balance Overall balance assessment: Needs assistance Sitting-balance support: No upper extremity supported;Feet supported Sitting balance-Leahy Scale: Fair     Standing balance support: No upper extremity supported;During functional activity Standing balance-Leahy Scale: Fair                               Pertinent Vitals/Pain Pain Assessment: Faces Faces Pain Scale: Hurts little more Pain Location: Abdomen Pain Descriptors / Indicators: Aching Pain Intervention(s): Limited activity within patient's tolerance;Repositioned;Monitored during session    Home Living Family/patient expects to be discharged to:: Private residence Living Arrangements: Alone Available Help at Discharge: Family;Available PRN/intermittently;Other (Comment) (Children live nearby and have offerred to let pt live with them post-d/c if needed) Type of Home: Apartment (Indep living apartment) Home Access: Level entry     Home Layout: One level Home Equipment: None      Prior Function Level of Independence: Independent         Comments: Does not drive, rides bus for errands. Multiple children live nearby.     Hand Dominance        Extremity/Trunk Assessment  Upper Extremity Assessment Upper Extremity Assessment: Generalized weakness    Lower Extremity Assessment Lower Extremity Assessment: Generalized weakness       Communication   Communication: No difficulties  Cognition Arousal/Alertness: Awake/alert Behavior During Therapy: WFL for tasks assessed/performed Overall Cognitive Status: Within Functional Limits for tasks assessed                       General Comments General comments (skin integrity, edema, etc.): At rest: BP 168/184, HR 110s. Post-amb: BP 176/95. HR up to 120s with amb. SpO2 remained >88% on RA; >92% on 1L O2 [employer].     Exercises     Assessment/Plan    PT Assessment Patient needs continued PT services  PT Problem List Decreased strength;Decreased activity tolerance;Decreased balance;Decreased mobility;Cardiopulmonary status limiting activity;Pain       PT Treatment Interventions Gait training;Therapeutic activities;Therapeutic exercise;Patient/family education;Balance training;Functional mobility training;DME instruction    PT Goals (Current goals can be found in the Care Plan section)  Acute Rehab PT Goals Patient Stated Goal: Return home PT Goal Formulation: With patient Time For Goal Achievement: 04/27/16 Potential to Achieve Goals: Good    Frequency Min 2X/week   Barriers to discharge        Co-evaluation               End of Session Equipment Utilized During Treatment: Gait belt Activity Tolerance: Patient tolerated treatment well Patient left: in chair;with nursing/sitter in room;with call bell/phone within reach Nurse Communication: Mobility status PT Visit Diagnosis: Unsteadiness on feet (R26.81);Muscle weakness (generalized) (M62.81)         Time: 4098-1191 PT Time Calculation (min) (ACUTE ONLY): 29 min   Charges:   PT Evaluation $PT Eval Moderate Complexity: 1 Procedure PT Treatments $Therapeutic Activity: 8-22 mins   PT G Codes:       Dewayne Hatch, SPT [phone]  Ina Homes 04/13/2016, 2:29 PM

## 2016-04-13 NOTE — Progress Notes (Deleted)
O2 at 2LNC while ambulating O2 sat 97%.  O2 sat on RA = 88%. 

## 2016-04-14 LAB — CBC
HCT: 26.6 % — ABNORMAL LOW (ref 36.0–46.0)
HEMOGLOBIN: 8.6 g/dL — AB (ref 12.0–15.0)
MCH: 26.8 pg (ref 26.0–34.0)
MCHC: 32.3 g/dL (ref 30.0–36.0)
MCV: 82.9 fL (ref 78.0–100.0)
PLATELETS: 200 10*3/uL (ref 150–400)
RBC: 3.21 MIL/uL — AB (ref 3.87–5.11)
RDW: 14 % (ref 11.5–15.5)
WBC: 12.7 10*3/uL — ABNORMAL HIGH (ref 4.0–10.5)

## 2016-04-14 LAB — BASIC METABOLIC PANEL
Anion gap: 6 (ref 5–15)
BUN: 12 mg/dL (ref 6–20)
CHLORIDE: 101 mmol/L (ref 101–111)
CO2: 25 mmol/L (ref 22–32)
Calcium: 8.6 mg/dL — ABNORMAL LOW (ref 8.9–10.3)
Creatinine, Ser: 0.99 mg/dL (ref 0.44–1.00)
GFR calc Af Amer: >60 mL/min (ref >60)
GFR calc non Af Amer: 55 mL/min — ABNORMAL LOW (ref >60)
GLUCOSE: 150 mg/dL — AB (ref 65–99)
POTASSIUM: 4.1 mmol/L (ref 3.5–5.1)
Sodium: 132 mmol/L — ABNORMAL LOW (ref 135–145)

## 2016-04-14 LAB — GLUCOSE, CAPILLARY
GLUCOSE-CAPILLARY: 147 mg/dL — AB (ref 65–99)
Glucose-Capillary: 133 mg/dL — ABNORMAL HIGH (ref 65–99)
Glucose-Capillary: 144 mg/dL — ABNORMAL HIGH (ref 65–99)
Glucose-Capillary: 150 mg/dL — ABNORMAL HIGH (ref 65–99)
Glucose-Capillary: 161 mg/dL — ABNORMAL HIGH (ref 65–99)
Glucose-Capillary: 173 mg/dL — ABNORMAL HIGH (ref 65–99)

## 2016-04-14 MED ORDER — CHLORHEXIDINE GLUCONATE 0.12 % MT SOLN
15.0000 mL | Freq: Two times a day (BID) | OROMUCOSAL | Status: DC
  Administered 2016-04-14 – 2016-04-16 (×4): 15 mL via OROMUCOSAL
  Filled 2016-04-14 (×3): qty 15

## 2016-04-14 MED ORDER — METOPROLOL TARTRATE 5 MG/5ML IV SOLN: 2.0000 mg | INTRAVENOUS | Status: DC | PRN

## 2016-04-14 MED ORDER — KETOROLAC TROMETHAMINE 30 MG/ML IJ SOLN
15.0000 mg | Freq: Four times a day (QID) | INTRAMUSCULAR | Status: AC
  Administered 2016-04-14 – 2016-04-17 (×10): 15 mg via INTRAVENOUS
  Filled 2016-04-14 (×10): qty 1

## 2016-04-14 MED ORDER — KETOROLAC TROMETHAMINE 30 MG/ML IJ SOLN
30.0000 mg | Freq: Four times a day (QID) | INTRAMUSCULAR | Status: DC
  Administered 2016-04-14: 30 mg via INTRAVENOUS
  Filled 2016-04-14: qty 1

## 2016-04-14 MED ORDER — LABETALOL HCL 5 MG/ML IV SOLN
10.0000 mg | INTRAVENOUS | Status: DC | PRN
  Administered 2016-04-14 – 2016-04-18 (×6): 10 mg via INTRAVENOUS
  Filled 2016-04-14 (×6): qty 4

## 2016-04-14 MED ORDER — ORAL CARE MOUTH RINSE
15.0000 mL | Freq: Two times a day (BID) | OROMUCOSAL | Status: DC
  Administered 2016-04-14 – 2016-04-16 (×5): 15 mL via OROMUCOSAL

## 2016-04-14 MED ORDER — LORAZEPAM 2 MG/ML IJ SOLN
0.5000 mg | Freq: Once | INTRAMUSCULAR | Status: AC
Start: 2016-04-14 — End: 2016-04-15
  Administered 2016-04-15: 0.5 mg via INTRAVENOUS
  Filled 2016-04-14: qty 1

## 2016-04-14 MED ORDER — HYDRALAZINE HCL 20 MG/ML IJ SOLN
5.0000 mg | INTRAMUSCULAR | Status: DC | PRN
  Administered 2016-04-15: 5 mg via INTRAVENOUS
  Filled 2016-04-14: qty 0.25
  Filled 2016-04-14: qty 1

## 2016-04-14 NOTE — NC FL2 (Signed)
[place] MEDICAID FL2 LEVEL OF CARE SCREENING TOOL     IDENTIFICATION  Patient Name: Taylor Long Birthdate: 1940-04-02 Sex: female Admission Date (Current Location): 04/05/2016  [facility] and [place] Number:  Producer, television/film/video and Address:  [facility], [address] [place], [place] [address]      Provider Number: [id number]  Attending Physician Name and Address:  Chuck Hint, MD  Relative Name and Phone Number:       Current Level of Care: Hospital Recommended Level of Care: Skilled Nursing Facility Prior Approval Number:    Date Approved/Denied:   PASRR Number: [id number]  Discharge Plan: SNF    Current Diagnoses: Patient Active Problem List   Diagnosis Date Noted  . S/P cardiac catheterization:  (04/09/2016) a. Prox Cx lesion, 80% sten- focal napkin ring calcified lesion b. Ost LM to Prox LAD- dense mod calc  c. Prox RCA lesion, 25% sten- diffuse calc  04/10/2016  . CKD (chronic kidney disease), stage III 04/10/2016  . CAD (coronary artery disease) 04/10/2016  . Exertional angina (HCC)   . Abdominal aortic aneurysm (AAA) without rupture (HCC)   . Hypertensive emergency   . Hypokalemia 04/06/2016  . Hypertensive heart failure (HCC) 04/06/2016  . History of abdominal aortic aneurysm (AAA) 04/05/2016  . PELVIC  PAIN 10/07/2008  . DIABETES MELLITUS, TYPE II 08/10/2008  . HYPERLIPIDEMIA 07/13/2008  . TOBACCO ABUSE 07/13/2008  . ANXIETY 01/04/2008  . HYPERTENSION 01/04/2008  . LEG CRAMPS 01/04/2008  . Palpitations 01/04/2008    Orientation RESPIRATION BLADDER Height & Weight     Self, Situation, Place, Time  O2 (Nasal Cannula, 1L) Continent Weight: 167 lb 1.7 oz (75.8 kg) Height:  5\' 6"  (167.6 cm)  BEHAVIORAL SYMPTOMS/MOOD NEUROLOGICAL BOWEL NUTRITION STATUS      Continent  (Please see discharge summary)  AMBULATORY STATUS COMMUNICATION OF NEEDS Skin   Limited Assist Verbally Surgical wounds (Closed incision, abdomen,  liquid adhesive bandage)                       Personal Care Assistance Level of Assistance  Bathing, Feeding, Dressing Bathing Assistance: Limited assistance Feeding assistance: Independent Dressing Assistance: Limited assistance     Functional Limitations Info  Sight, Hearing, Speech Sight Info: Adequate Hearing Info: Adequate Speech Info: Adequate    SPECIAL CARE FACTORS FREQUENCY  PT (By licensed PT), OT (By licensed OT)     PT Frequency: min 2x week OT Frequency: min 2x week            Contractures Contractures Info: Not present    Additional Factors Info  Code Status, Allergies Code Status Info: Full Code Allergies Info: Latex           Current Medications (04/14/2016):  This is the current hospital active medication list Current Facility-Administered Medications  Medication Dose Route Frequency Provider Last Rate Last Dose  . 0.9 %  sodium chloride infusion  500 mL Intravenous Once PRN Lars Mage, PA-C      . acetaminophen (TYLENOL) tablet 325-650 mg  325-650 mg Oral Q4H PRN Lars Mage, PA-C       Or  . acetaminophen (TYLENOL) suppository 325-650 mg  325-650 mg Rectal Q4H PRN Lars Mage, PA-C      . alum & mag hydroxide-simeth (MAALOX/MYLANTA) 200-200-20 MG/5ML suspension 15-30 mL  15-30 mL Oral Q2H PRN Lars Mage, PA-C      . bisacodyl (DULCOLAX) suppository 10 mg  10 mg Rectal Daily PRN Lars MageEmma M Ladell Lea, PA-C      . chlorhexidine (PERIDEX) 0.12 % solution 15 mL  15 mL Mouth Rinse BID Nada LibmanVance W Brabham, MD   15 mL at 04/14/16 1133  . dextrose 5 % and 0.45 % NaCl with KCl 20 mEq/L infusion   Intravenous Continuous Lars MageEmma M Jatinder Mcdonagh, PA-C 125 mL/hr at 04/14/16 1300    . docusate sodium (COLACE) capsule 100 mg  100 mg Oral Daily Lars MageEmma M Gared Gillie, PA-C      . enoxaparin (LOVENOX) injection 40 mg  40 mg Subcutaneous Q24H Lars MageEmma M Demetra Moya, PA-C   40 mg at 04/14/16 [id number]  . guaiFENesin-dextromethorphan (ROBITUSSIN DM) 100-10 MG/5ML syrup 15 mL  15 mL Oral  Q4H PRN Lars MageEmma M Rilan Eiland, PA-C      . HYDROmorphone (DILAUDID) injection 0.5-1 mg  0.5-1 mg Intravenous Q2H PRN Lars MageEmma M Jensyn Cambria, PA-C   0.5 mg at 04/14/16 0755  . insulin aspart (novoLOG) injection 0-9 Units  0-9 Units Subcutaneous Q4H Nada LibmanVance W Brabham, MD   2 Units at 04/14/16 1141  . ketorolac (TORADOL) 30 MG/ML injection 15 mg  15 mg Intravenous Q6H Chuck Hinthristopher S Dickson, MD      . magnesium sulfate IVPB 2 g 50 mL  2 g Intravenous Daily PRN Lars MageEmma M Jeanene Mena, PA-C      . MEDLINE mouth rinse  15 mL Mouth Rinse q12n4p Nada LibmanVance W Brabham, MD      . nitroGLYCERIN 50 mg in dextrose 5 % 250 mL (0.2 mg/mL) infusion  2-200 mcg/min Intravenous Titrated Lars MageEmma M Tomia Enlow, PA-C 46.5 mL/hr at 04/14/16 1300 155 mcg/min at 04/14/16 1300  . ondansetron (ZOFRAN) injection 4 mg  4 mg Intravenous Q6H PRN Lars MageEmma M Maurene Hollin, PA-C   4 mg at 04/13/16 1443  . pantoprazole (PROTONIX) EC tablet 40 mg  40 mg Oral Daily Lars MageEmma M Artice Holohan, PA-C   Stopped at 04/13/16 1000  . phenol (CHLORASEPTIC) mouth spray 1 spray  1 spray Mouth/Throat PRN Lars MageEmma M Javiana Anwar, PA-C   1 spray at 04/13/16 1939  . [START ON 04/16/2016] pneumococcal 23 valent vaccine (PNU-IMMUNE) injection 0.5 mL  0.5 mL Intramuscular Tomorrow-1000 Chuck Hinthristopher S Dickson, MD      . potassium chloride SA (K-DUR,KLOR-CON) CR tablet 20-40 mEq  20-40 mEq Oral Daily PRN Lars MageEmma M Rylie Limburg, PA-C         Discharge Medications: Please see discharge summary for a list of discharge medications.  Relevant Imaging Results:  Relevant Lab Results:   Additional Information ssn: [id number]  Maree KrabbeBridget A Cobb, LCSW

## 2016-04-14 NOTE — Progress Notes (Signed)
    Subjective  - POD #2  c/o pain   Physical Exam:  abd tender but soft Palpable pedal pulses Non-labored brathing       Assessment/Plan:  POD #2  Hypertension: The patient remains on a nitroglycerin drip, will try yo wean.  D/c aline GI: Mod NG tube output.  Very uncomfortable for her, will d/c GU: Adequate urinary output.  creatiine at base line.  D/C foley Prophylaxis:  Pepcid and lovenox Dispo: to stay in ICU due to NTG drip PT/OT  Needs to get OOB today Pain:  Starting toradol.  Watch creatinine  Kambree Krauss, Wells 04/14/2016 8:21 AM --  Vitals:   04/14/16 0730 04/14/16 0800  BP: 131/66 (!) 150/75  Pulse: (!) 115 (!) 118  Resp: (!) 22 14  Temp:      Intake/Output Summary (Last 24 hours) at 04/14/16 0821 Last data filed at 04/14/16 0800  Gross per 24 hour  Intake           4153.7 ml  Output             1850 ml  Net           2303.7 ml     Laboratory CBC    Component Value Date/Time   WBC 12.7 (H) 04/14/2016 0352   HGB 8.6 (L) 04/14/2016 0352   HCT 26.6 (L) 04/14/2016 0352   PLT 200 04/14/2016 0352    BMET    Component Value Date/Time   NA 132 (L) 04/14/2016 0352   K 4.1 04/14/2016 0352   CL 101 04/14/2016 0352   CO2 25 04/14/2016 0352   GLUCOSE 150 (H) 04/14/2016 0352   BUN 12 04/14/2016 0352   CREATININE 0.99 04/14/2016 0352   CALCIUM 8.6 (L) 04/14/2016 0352   GFRNONAA 55 (L) 04/14/2016 0352   GFRAA >60 04/14/2016 0352    COAG Lab Results  Component Value Date   INR 1.21 04/12/2016   INR 1.04 04/12/2016   INR 1.03 04/09/2016   No results found for: PTT  Antibiotics Anti-infectives    Start     Dose/Rate Route Frequency Ordered Stop   04/12/16 1900  cefUROXime (ZINACEF) 1.5 g in dextrose 5 % 50 mL IVPB     1.5 g 100 mL/hr over 30 Minutes Intravenous Every 12 hours 04/12/16 1311 04/13/16 0630   04/12/16 0700  cefUROXime (ZINACEF) 1.5 g in dextrose 5 % 50 mL IVPB     1.5 g 100 mL/hr over 30 Minutes Intravenous On call to O.R.  04/11/16 [id number] 04/12/16 0747   04/09/16 0600  cefUROXime (ZINACEF) 1.5 g in dextrose 5 % 50 mL IVPB  Status:  Discontinued     1.5 g 100 mL/hr over 30 Minutes Intravenous On call to O.R. 04/08/16 [id number] 04/10/16 2349       V. Charlena CrossWells Meliyah Simon IV, M.D. [facility] [place] Office: [phone] Pager:  [phone]

## 2016-04-15 ENCOUNTER — Inpatient Hospital Stay (HOSPITAL_COMMUNITY): Payer: Medicare Other

## 2016-04-15 ENCOUNTER — Encounter (HOSPITAL_COMMUNITY): Payer: Self-pay | Admitting: Vascular Surgery

## 2016-04-15 DIAGNOSIS — I10 Essential (primary) hypertension: Secondary | ICD-10-CM

## 2016-04-15 LAB — GLUCOSE, CAPILLARY
GLUCOSE-CAPILLARY: 100 mg/dL — AB (ref 65–99)
GLUCOSE-CAPILLARY: 159 mg/dL — AB (ref 65–99)
Glucose-Capillary: 102 mg/dL — ABNORMAL HIGH (ref 65–99)
Glucose-Capillary: 112 mg/dL — ABNORMAL HIGH (ref 65–99)
Glucose-Capillary: 119 mg/dL — ABNORMAL HIGH (ref 65–99)
Glucose-Capillary: 148 mg/dL — ABNORMAL HIGH (ref 65–99)

## 2016-04-15 LAB — TYPE AND SCREEN
ABO/RH(D): A POS
ANTIBODY SCREEN: NEGATIVE
UNIT DIVISION: 0
Unit division: 0

## 2016-04-15 LAB — BPAM RBC
BLOOD PRODUCT EXPIRATION DATE: 201803282359
BLOOD PRODUCT EXPIRATION DATE: 201803282359
ISSUE DATE / TIME: 201803160729
ISSUE DATE / TIME: 201803160729
UNIT TYPE AND RH: 6200
Unit Type and Rh: 6200

## 2016-04-15 LAB — CBC
HCT: 24.5 % — ABNORMAL LOW (ref 36.0–46.0)
Hemoglobin: 8 g/dL — ABNORMAL LOW (ref 12.0–15.0)
MCH: 27.2 pg (ref 26.0–34.0)
MCHC: 32.7 g/dL (ref 30.0–36.0)
MCV: 83.3 fL (ref 78.0–100.0)
PLATELETS: 199 10*3/uL (ref 150–400)
RBC: 2.94 MIL/uL — ABNORMAL LOW (ref 3.87–5.11)
RDW: 14 % (ref 11.5–15.5)
WBC: 10.1 10*3/uL (ref 4.0–10.5)

## 2016-04-15 LAB — BASIC METABOLIC PANEL
ANION GAP: 8 (ref 5–15)
BUN: 9 mg/dL (ref 6–20)
CO2: 26 mmol/L (ref 22–32)
CREATININE: 0.97 mg/dL (ref 0.44–1.00)
Calcium: 8.4 mg/dL — ABNORMAL LOW (ref 8.9–10.3)
Chloride: 99 mmol/L — ABNORMAL LOW (ref 101–111)
GFR calc Af Amer: >60 mL/min (ref >60)
GFR, EST NON AFRICAN AMERICAN: 57 mL/min — AB (ref >60)
GLUCOSE: 142 mg/dL — AB (ref 65–99)
Potassium: 4.2 mmol/L (ref 3.5–5.1)
Sodium: 133 mmol/L — ABNORMAL LOW (ref 135–145)

## 2016-04-15 MED ORDER — LOSARTAN POTASSIUM 50 MG PO TABS
100.0000 mg | ORAL_TABLET | Freq: Every day | ORAL | Status: DC
Start: 2016-04-15 — End: 2016-04-19
  Administered 2016-04-15 – 2016-04-19 (×5): 100 mg via ORAL
  Filled 2016-04-15 (×5): qty 2

## 2016-04-15 MED ORDER — AMLODIPINE BESYLATE 5 MG PO TABS
5.0000 mg | ORAL_TABLET | Freq: Every day | ORAL | Status: DC
  Administered 2016-04-16: 5 mg via ORAL
  Filled 2016-04-15: qty 1

## 2016-04-15 MED ORDER — METOPROLOL TARTRATE 50 MG PO TABS
50.0000 mg | ORAL_TABLET | Freq: Three times a day (TID) | ORAL | Status: DC
Start: 2016-04-15 — End: 2016-04-15
  Administered 2016-04-15: 50 mg via ORAL
  Filled 2016-04-15: qty 1

## 2016-04-15 MED ORDER — METOPROLOL TARTRATE 5 MG/5ML IV SOLN
5.0000 mg | Freq: Four times a day (QID) | INTRAVENOUS | Status: DC
Start: 2016-04-15 — End: 2016-04-17
  Administered 2016-04-16 – 2016-04-17 (×6): 5 mg via INTRAVENOUS
  Filled 2016-04-15 (×8): qty 5

## 2016-04-15 MED ORDER — AMLODIPINE BESYLATE 5 MG PO TABS: 5.0000 mg | ORAL_TABLET | Freq: Two times a day (BID) | ORAL | Status: DC

## 2016-04-15 MED ORDER — FUROSEMIDE 10 MG/ML IJ SOLN
40.0000 mg | Freq: Once | INTRAMUSCULAR | Status: AC
  Administered 2016-04-15: 40 mg via INTRAVENOUS
  Filled 2016-04-15: qty 4

## 2016-04-15 MED ORDER — AMLODIPINE BESYLATE 5 MG PO TABS
5.0000 mg | ORAL_TABLET | Freq: Every day | ORAL | Status: DC
  Administered 2016-04-15: 5 mg via ORAL
  Filled 2016-04-15: qty 1

## 2016-04-15 NOTE — Care Management Note (Addendum)
Case Management Note  Patient Details  Name: Taylor Long MRN: [id number] Date of Birth: 04/24/1940  Subjective/Objective:   Pt admitted with 8 cm abdominal aortic aneurysm- plan for OR on 3/16 for repair.  3/19 1359 [name] RN, BSN - POD 3 open repair of AAA. Starting clears, starting po hypertensive meds, weaning off ntg drip, NG tube is out. Will transfer to floor once off ntg drip.  Per pt eval rec snf, CSW aware.  NCM will cont to follow for dc needs.  3/20 [name] RN CM Addendum- Per CSW note, patient would prefer to DC to SNF because she does not want to stay with her son and his 5 kids. Prefers [facility], CSW facilitating SNF for DC.  17:25 Patient ambulated 300 feet with PT, CM will continue to follow.     04/17/16 13:20 Addendum [name] RN CM Spoke with patient, she states she does not want to go to her son's house who has 5 children, her other son [name] is coming from [place] on Friday and she states he will stay with he if SNF is not approved. CM requested clarification from CSW for SNF approval 3/21, prepared patient for possibility of going home at DC. Patient states she will need a RW. She would like a HH RN and HHA. [facility], stated she would like any provider that would take her insurance. Request made to [facility] liaison Alroy Dustdecia [phone], waiting to hear back. 3/21 15:50 [facility] and [facility] unable to accept at this time, [facility] clinical liaison Kandee KeenCory able to accept for RN HHA as backup to SNF.  3/21 16:00 Received update from CSW. Anticipate DC to [facility] w/ [facility]. Will need HH orders at DC and RW.                                  Action/Plan:   Expected Discharge Date:                  Expected Discharge Plan:  Skilled Nursing Facility  In-House Referral:  Clinical Social Work  Discharge planning Services  CM Consult  Post Acute Care Choice:    Choice offered to:     DME Arranged:    DME Agency:     HH Arranged:     HH Agency:     Status of Service:  In process, will continue to follow  If discussed at Long Length of Stay Meetings, dates discussed:    Additional Comments:  [name], [name], RN 04/15/2016, 1:58 PM

## 2016-04-15 NOTE — NC FL2 (Signed)
[place] MEDICAID FL2 LEVEL OF CARE SCREENING TOOL     IDENTIFICATION  Patient Name: Taylor Long Birthdate: Jun 15, 1940 Sex: female Admission Date (Current Location): 04/05/2016  [facility] and [place] Number:  Producer, television/film/video and Address:  [facility], [address], [place], [place] [address]      Provider Number: [id number]  Attending Physician Name and Address:  Chuck Hint, MD  Relative Name and Phone Number:       Current Level of Care: Hospital Recommended Level of Care: Skilled Nursing Facility Prior Approval Number:    Date Approved/Denied:   PASRR Number: [id number]  Discharge Plan: SNF    Current Diagnoses: Patient Active Problem List   Diagnosis Date Noted  . S/P cardiac catheterization:  (04/09/2016) a. Prox Cx lesion, 80% sten- focal napkin ring calcified lesion b. Ost LM to Prox LAD- dense mod calc  c. Prox RCA lesion, 25% sten- diffuse calc  04/10/2016  . CKD (chronic kidney disease), stage III 04/10/2016  . CAD (coronary artery disease) 04/10/2016  . Exertional angina (HCC)   . Abdominal aortic aneurysm (AAA) without rupture (HCC)   . Hypertensive emergency   . Hypokalemia 04/06/2016  . Hypertensive heart failure (HCC) 04/06/2016  . History of abdominal aortic aneurysm (AAA) 04/05/2016  . PELVIC  PAIN 10/07/2008  . DIABETES MELLITUS, TYPE II 08/10/2008  . HYPERLIPIDEMIA 07/13/2008  . TOBACCO ABUSE 07/13/2008  . ANXIETY 01/04/2008  . HYPERTENSION 01/04/2008  . LEG CRAMPS 01/04/2008  . Palpitations 01/04/2008    Orientation RESPIRATION BLADDER Height & Weight     Self, Situation, Place, Time  O2 (Nasal Cannula, 1L) Continent Weight: 167 lb 1.7 oz (75.8 kg) Height:  5\' 6"  (167.6 cm)  BEHAVIORAL SYMPTOMS/MOOD NEUROLOGICAL BOWEL NUTRITION STATUS      Continent  (Please see discharge summary)  AMBULATORY STATUS COMMUNICATION OF NEEDS Skin   Limited Assist Verbally Surgical wounds (Closed incision, abdomen,  liquid adhesive bandage)                       Personal Care Assistance Level of Assistance  Bathing, Feeding, Dressing Bathing Assistance: Limited assistance Feeding assistance: Independent Dressing Assistance: Limited assistance     Functional Limitations Info  Sight, Hearing, Speech Sight Info: Adequate Hearing Info: Adequate Speech Info: Adequate    SPECIAL CARE FACTORS FREQUENCY  PT (By licensed PT), OT (By licensed OT)     PT Frequency: min 2x week OT Frequency: min 2x week            Contractures Contractures Info: Not present    Additional Factors Info  Code Status, Allergies Code Status Info: Full Code Allergies Info: Latex           Current Medications (04/15/2016):  This is the current hospital active medication list Current Facility-Administered Medications  Medication Dose Route Frequency Provider Last Rate Last Dose  . 0.9 %  sodium chloride infusion  500 mL Intravenous Once PRN Lars Mage, PA-C      . acetaminophen (TYLENOL) tablet 325-650 mg  325-650 mg Oral Q4H PRN Lars Mage, PA-C       Or  . acetaminophen (TYLENOL) suppository 325-650 mg  325-650 mg Rectal Q4H PRN Lars Mage, PA-C      . alum & mag hydroxide-simeth (MAALOX/MYLANTA) 200-200-20 MG/5ML suspension 15-30 mL  15-30 mL Oral Q2H PRN Lars Mage, PA-C      . amLODipine (NORVASC) tablet 5 mg  5 mg Oral Daily Dietrich PatesPaula Ross V, MD      . bisacodyl (DULCOLAX) suppository 10 mg  10 mg Rectal Daily PRN Lars MageEmma M Collins, PA-C      . chlorhexidine (PERIDEX) 0.12 % solution 15 mL  15 mL Mouth Rinse BID Nada LibmanVance W Brabham, MD   15 mL at 04/15/16 0953  . dextrose 5 % and 0.45 % NaCl with KCl 20 mEq/L infusion   Intravenous Continuous Chuck Hinthristopher S Dickson, MD 75 mL/hr at 04/15/16 1300    . docusate sodium (COLACE) capsule 100 mg  100 mg Oral Daily Lars MageEmma M Collins, PA-C   100 mg at 04/15/16 [id number]  . enoxaparin (LOVENOX) injection 40 mg  40 mg Subcutaneous Q24H Lars MageEmma M Collins, PA-C   40 mg at  04/15/16 [id number]  . guaiFENesin-dextromethorphan (ROBITUSSIN DM) 100-10 MG/5ML syrup 15 mL  15 mL Oral Q4H PRN Lars MageEmma M Collins, PA-C      . hydrALAZINE (APRESOLINE) injection 5 mg  5 mg Intravenous Q20 Min PRN Nada LibmanVance W Brabham, MD   5 mg at 04/15/16 1135  . HYDROmorphone (DILAUDID) injection 0.5-1 mg  0.5-1 mg Intravenous Q2H PRN Lars MageEmma M Collins, PA-C   1 mg at 04/15/16 0825  . insulin aspart (novoLOG) injection 0-9 Units  0-9 Units Subcutaneous Q4H Nada LibmanVance W Brabham, MD   1 Units at 04/15/16 0845  . ketorolac (TORADOL) 30 MG/ML injection 15 mg  15 mg Intravenous Q6H Chuck Hinthristopher S Dickson, MD   15 mg at 04/15/16 1430  . labetalol (NORMODYNE,TRANDATE) injection 10 mg  10 mg Intravenous Q10 min PRN Nada LibmanVance W Brabham, MD   10 mg at 04/15/16 0654  . losartan (COZAAR) tablet 100 mg  100 mg Oral Daily Lars MageEmma M Collins, PA-C   100 mg at 04/15/16 [id number]  . magnesium sulfate IVPB 2 g 50 mL  2 g Intravenous Daily PRN Lars MageEmma M Collins, PA-C      . MEDLINE mouth rinse  15 mL Mouth Rinse q12n4p Nada LibmanVance W Brabham, MD   15 mL at 04/15/16 1653  . metoprolol (LOPRESSOR) injection 2-5 mg  2-5 mg Intravenous Q2H PRN Nada LibmanVance W Brabham, MD      . metoprolol (LOPRESSOR) injection 5 mg  5 mg Intravenous Q6H Dietrich PatesPaula Ross V, MD      . nitroGLYCERIN 50 mg in dextrose 5 % 250 mL (0.2 mg/mL) infusion  2-200 mcg/min Intravenous Titrated Lars MageEmma M Collins, PA-C 42 mL/hr at 04/15/16 1300 140 mcg/min at 04/15/16 1300  . ondansetron (ZOFRAN) injection 4 mg  4 mg Intravenous Q6H PRN Lars MageEmma M Collins, PA-C   4 mg at 04/14/16 1518  . pantoprazole (PROTONIX) EC tablet 40 mg  40 mg Oral Daily Lars MageEmma M Collins, PA-C   40 mg at 04/15/16 [id number]  . phenol (CHLORASEPTIC) mouth spray 1 spray  1 spray Mouth/Throat PRN Lars MageEmma M Collins, PA-C   1 spray at 04/13/16 1939  . [START ON 04/16/2016] pneumococcal 23 valent vaccine (PNU-IMMUNE) injection 0.5 mL  0.5 mL Intramuscular Tomorrow-1000 Chuck Hinthristopher S Dickson, MD      . potassium chloride SA (K-DUR,KLOR-CON) CR tablet 20-40 mEq   20-40 mEq Oral Daily PRN Lars MageEmma M Collins, PA-C         Discharge Medications: Please see discharge summary for a list of discharge medications.  Relevant Imaging Results:  Relevant Lab Results:   Additional Information ssn: [id number]  Althea CharonAshley C Geramy Lamorte, LCSW

## 2016-04-15 NOTE — Clinical Social Work Placement (Signed)
   CLINICAL SOCIAL WORK PLACEMENT  NOTE  Date:  04/15/2016  Patient Details  Name: Camillia Herterlaine T Pasquarella MRN: [id number] Date of Birth: 03/22/1940  Clinical Social Work is seeking post-discharge placement for this patient at the Skilled  Nursing Facility level of care (*CSW will initial, date and re-position this form in  chart as items are completed):  Yes   Patient/family provided with [facility] Clinical Social Work Department's list of facilities offering this level of care within the geographic area requested by the patient (or if unable, by the patient's family).  Yes   Patient/family informed of their freedom to choose among providers that offer the needed level of care, that participate in Medicare, Medicaid or managed care program needed by the patient, have an available bed and are willing to accept the patient.  Yes   Patient/family informed of [facility]'s ownership interest in [facility] and [facility], as well as of the fact that they are under no obligation to receive care at these facilities.  PASRR submitted to EDS on       PASRR number received on       Existing PASRR number confirmed on       FL2 transmitted to all facilities in geographic area requested by pt/family on       FL2 transmitted to all facilities within larger geographic area on       Patient informed that his/her managed care company has contracts with or will negotiate with certain facilities, including the following:            Patient/family informed of bed offers received.  Patient chooses bed at       Physician recommends and patient chooses bed at      Patient to be transferred to   on  .  Patient to be transferred to facility by       Patient family notified on   of transfer.  Name of family member notified:        PHYSICIAN Please sign FL2     Additional Comment:    _______________________________________________ Althea CharonAshley C Elleanor Guyett, LCSW 04/15/2016, 5:25 PM

## 2016-04-15 NOTE — Plan of Care (Signed)
Problem: Fluid Volume: Goal: Ability to maintain a balanced intake and output will improve Outcome: Progressing Pt is voiding post foley catheter removal

## 2016-04-15 NOTE — Progress Notes (Addendum)
[facility]  VASCULAR SURGERY ASSESSMENT AND PLAN:  GI/NUTRITION: She is tolerating having her NG tube out. She has passed some flatus. Will start sips of clear liquids.  HYPERTENSION: This continues to be an issue for her. I have asked cardiology to help us with management of her blood pressure as she will likely need some adjustment in her medications prior to discharge.  Transfer to floor once she is off the nitroglycerin which we are trying to wean.   Ambulate.  Continue IS.  She has decreased breath sounds at the bases and I will turn down her IV fluid and give her IV Lasix. BMET has been ordered for tomorrow.  Waverly Ferrarihristopher Dickson, MD, FACS Beeper [phone] Office: [phone]  Subjective  - Feels short of breath.  Objective (!) 145/74 93 98.6 F (37 C) (Oral) (!) 27 96%  Intake/Output Summary (Last 24 hours) at 04/15/16 0748 Last data filed at 04/15/16 0600  Gross per 24 hour  Intake           3988.8 ml  Output             2375 ml  Net           1613.8 ml    O2 SAT 97 on 1L, shallow breathing Abdomin soft, + BS Palpable DP 3+  Assessment/Planning: POD # 3 open repair AAA  Will start PO clear liquids slowly, Start anti hypertensive's PO today and try to wean off NTG IS at bed side, previous chest x ray 3/17  IMPRESSION: 1. Enteric tube removed. 2. Mild bibasilar atelectasis. Encourage mobility HGB 8.0 asymptomatic sitting up in chair.   [name], Taylor Long 04/15/2016 7:48 AM --  Laboratory Lab Results:  Recent Labs  04/14/16 0352 04/15/16 0345  WBC 12.7* 10.1  HGB 8.6* 8.0*  HCT 26.6* 24.5*  PLT 200 199   BMET  Recent Labs  04/14/16 0352 04/15/16 0345  NA 132* 133*  K 4.1 4.2  CL 101 99*  CO2 25 26  GLUCOSE 150* 142*  BUN 12 9  CREATININE 0.99 0.97  CALCIUM 8.6* 8.4*    COAG Lab Results  Component Value Date   INR 1.21 04/12/2016   INR 1.04 04/12/2016   INR 1.03 04/09/2016   No results  found for: PTT

## 2016-04-15 NOTE — Progress Notes (Addendum)
Occupational Therapy Evaluation Patient Details Name: Taylor Long MRN: [id number] DOB: 07/30/1940 Today's Date: 04/15/2016    History of Present Illness Pt is a 76 y.o. female who presented to ED from PCP on 04/05/16 with abdominal pain. CT showed 8cm AAA. Cardiac cath 3/13 revealed severe single vessel disease. Pt now s/p abdominal aortic aneurysm repair on 3/16. PMH includes HTN.    Clinical Impression   PTA, pt independent with ADL and functional mobility for ADL and lived in a "senior apt". Pt demonstrates a functional decline due to below listed deficits and would benefit from rehab at SNF to facilitate return to PLOF and safe DC home. Pt currently requires min A with mobility and ADL and assist with all IADL tasks. Will follow acutely to address established goals and facilitate safe DC to next venue of care.  VSS throughout session. BP 105/77 end of session (supine)    Follow Up Recommendations  SNF;Supervision/Assistance - 24 hour    Equipment Recommendations  3 in 1 bedside commode    Recommendations for Other Services       Precautions / Restrictions Precautions Precautions: Fall Restrictions Weight Bearing Restrictions: No      Mobility Bed Mobility Overal bed mobility: Needs Assistance Bed Mobility: Rolling;Sidelying to Sit Rolling: Min assist Sidelying to sit: HOB elevated;Mod assist       General bed mobility comments: MinA for UE support into rolling. ModA to raise trunk into sitting and scoot hips to EOB.   Transfers Overall transfer level: Needs assistance Equipment used: 1 person hand held assist Transfers: Sit to/from [employer]GI CorporationStand;Stand Pivot Transfers Sit to Stand: Min assist Stand pivot transfers: Min assist       General transfer comment: steady A    Balance Overall balance assessment: Needs assistance Sitting-balance support: No upper extremity supported;Feet supported Sitting balance-Leahy Scale: Good     Standing balance support: No upper  extremity supported;During functional activity Standing balance-Leahy Scale: Fair                              ADL Overall ADL's : Needs assistance/impaired     Grooming: Set up;Sitting   Upper Body Bathing: Set up;Sitting   Lower Body Bathing: Minimal assistance;Sit to/from stand   Upper Body Dressing : Minimal assistance;Sitting   Lower Body Dressing: Minimal assistance;Sit to/from stand   Toilet Transfer: Minimal assistance;Ambulation   Toileting- Clothing Manipulation and Hygiene: Minimal assistance;Sit to/from stand       Functional mobility during ADLs: Minimal assistance General ADL Comments: easily fatigues. Feels "dizzy" when she stands  Takes bus/public transportation Py currently requires A for all IADL tasks     Vision Baseline Vision/History: Wears glasses Wears Glasses: At all times       Perception     Praxis      Pertinent Vitals/Pain Pain Assessment: No/denies pain     Hand Dominance Right   Extremity/Trunk Assessment Upper Extremity Assessment Upper Extremity Assessment: Generalized weakness   Lower Extremity Assessment Lower Extremity Assessment: Generalized weakness   Cervical / Trunk Assessment Cervical / Trunk Assessment: Normal   Communication Communication Communication: No difficulties   Cognition Arousal/Alertness: Awake/alert Behavior During Therapy: WFL for tasks assessed/performed Overall Cognitive Status: Within Functional Limits for tasks assessed                     General Comments       Exercises  Shoulder Instructions      Home Living Family/patient expects to be discharged to:: Private residence Living Arrangements: Alone Available Help at Discharge: Family;Available PRN/intermittently;Other (Comment) (Children live nearby and have offerred to let pt live with them post-d/c if needed) Type of Home: Apartment (Indep living apartment) Home Access: Level entry     Home Layout: One  level     Bathroom Shower/Tub: Chief Strategy Officer: Standard     Home Equipment: None          Prior Functioning/Environment Level of Independence: Independent        Comments: Does not drive, rides bus for errands. Multiple children live nearby.        OT Problem List: Decreased strength;Decreased activity tolerance;Decreased knowledge of use of DME or AE;Cardiopulmonary status limiting activity;Obesity      OT Treatment/Interventions: Self-care/ADL training;DME and/or AE instruction;Energy conservation;Therapeutic activities;Patient/family education;Balance training    OT Goals(Current goals can be found in the care plan section) Acute Rehab OT Goals Patient Stated Goal: Return home OT Goal Formulation: With patient Time For Goal Achievement: 04/29/16 Potential to Achieve Goals: Good ADL Goals Pt Will Perform Upper Body Bathing: with modified independence;sitting Pt Will Perform Lower Body Bathing: with modified independence;sit to/from stand Pt Will Perform Upper Body Dressing: with modified independence;sitting Pt Will Perform Lower Body Dressing: with modified independence;sit to/from stand Pt Will Transfer to Toilet: with modified independence;bedside commode;ambulating Pt Will Perform Toileting - Clothing Manipulation and hygiene: with modified independence;sit to/from stand  OT Frequency: Min 2X/week   Barriers to D/C:            Co-evaluation              End of Session Nurse Communication: Mobility status  Activity Tolerance: Patient tolerated treatment well Patient left: in bed;with call bell/phone within reach;with SCD's reapplied  OT Visit Diagnosis: Unsteadiness on feet (R26.81);Muscle weakness (generalized) (M62.81)                ADL either performed or assessed with clinical judgement  Time: 1459-1518 OT Time Calculation (min): 19 min Charges:  OT General Charges $OT Visit: 1 Procedure OT Evaluation $OT Eval Moderate  Complexity: 1 Procedure G-Codes:     Hamsa Laurich, OT/L  [phone] 04/15/2016  Taylor Long,[name] 04/15/2016, 3:28 PM

## 2016-04-15 NOTE — Progress Notes (Signed)
Progress Note  Patient Name: Taylor Long Date of Encounter: 04/15/2016  Primary Cardiologist: new- will be [place] office  Subjective   Up in chair  No CP  Breathing is not that good   Inpatient Medications    Scheduled Meds: . amLODipine  5 mg Oral Daily  . chlorhexidine  15 mL Mouth Rinse BID  . docusate sodium  100 mg Oral Daily  . enoxaparin (LOVENOX) injection  40 mg Subcutaneous Q24H  . insulin aspart  0-9 Units Subcutaneous Q4H  . ketorolac  15 mg Intravenous Q6H  . losartan  100 mg Oral Daily  . mouth rinse  15 mL Mouth Rinse q12n4p  . metoprolol tartrate  50 mg Oral Q8H  . pantoprazole  40 mg Oral Daily  . [START ON 04/16/2016] pneumococcal 23 valent vaccine  0.5 mL Intramuscular Tomorrow-1000   Continuous Infusions: . dextrose 5 % and 0.45 % NaCl with KCl 20 mEq/L 75 mL/hr at 04/15/16 0954  . nitroGLYCERIN 160 mcg/min (04/15/16 0646)   PRN Meds: sodium chloride, acetaminophen **OR** acetaminophen, alum & mag hydroxide-simeth, bisacodyl, guaiFENesin-dextromethorphan, hydrALAZINE, HYDROmorphone (DILAUDID) injection, labetalol, magnesium sulfate 1 - 4 g bolus IVPB, metoprolol, ondansetron, phenol, potassium chloride   Vital Signs    Vitals:   04/15/16 0400 04/15/16 0600 04/15/16 0700 04/15/16 0811  BP: (!) 154/63 (!) 164/79 (!) 145/74 (!) 165/90  Pulse: (!) 103 (!) 101 93 95  Resp: (!) 21 20 (!) 27   Temp: 98.6 F (37 C)   98.4 F (36.9 C)  TempSrc: Oral   Oral  SpO2: 96% 96% 96%   Weight:      Height:        Intake/Output Summary (Last 24 hours) at 04/15/16 1047 Last data filed at 04/15/16 0600  Gross per 24 hour  Intake           3469.8 ml  Output             2175 ml  Net           1294.8 ml   Filed Weights   04/06/16 0020 04/09/16 0424 04/14/16 0600  Weight: 151 lb 11.2 oz (68.8 kg) 157 lb 4.8 oz (71.4 kg) 167 lb 1.7 oz (75.8 kg)    Telemetry    HR 80's, NSR with Intermittent PVC's- Personally Reviewed  ECG    None since 04/07/2016 -  Personally Reviewed  Physical Exam   GEN: No acute distress.   Neck: No JVD, Lt CA bruit Cardiac: RRR, no murmurs, rubs, or gallops.  Respiratory: decreased airflow   MS: No edema; No deformity. Neuro:  Nonfocal  Psych: Normal affect   Labs    Chemistry  Recent Labs Lab 04/13/16 0303 04/14/16 0352 04/15/16 0345  NA 136 132* 133*  K 4.3 4.1 4.2  CL 104 101 99*  CO2 24 25 26   GLUCOSE 217* 150* 142*  BUN 17 12 9   CREATININE 1.22* 0.99 0.97  CALCIUM 8.5* 8.6* 8.4*  PROT 5.7*  --   --   ALBUMIN 3.0*  --   --   AST 25  --   --   ALT 19  --   --   ALKPHOS 47  --   --   BILITOT 0.8  --   --   GFRNONAA 43* 55* 57*  GFRAA 50* >60 >60  ANIONGAP 8 6 8      Hematology  Recent Labs Lab 04/13/16 0303 04/14/16 0352 04/15/16 0345  WBC 8.0 12.7*  10.1  RBC 3.68* 3.21* 2.94*  HGB 9.8* 8.6* 8.0*  HCT 30.6* 26.6* 24.5*  MCV 83.2 82.9 83.3  MCH 26.6 26.8 27.2  MCHC 32.0 32.3 32.7  RDW 14.5 14.0 14.0  PLT 203 200 199     Radiology    CT Chest/abd/pelv dissection eval. 04/05/16   IMPRESSION: Although there are no specific findings of acute aortic syndrome, there is an infrarenal abdominal aortic aneurysm measuring 8 cm, and the aortic wall appears somewhat thickened/irregular, potentially inflammatory/reactive. Vascular consultation is recommended.  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 The left kidney measures 8.1 cm compared to the right measuring 11.7 cm. Although the chronicity uncertain, findings may represent acute renal artery thrombosis superimposed on chronic stenosis, and could represent a source of left-sided abdominal pain.  Aortic atherosclerosis with severe mural soft plaque throughout the length of the thoracic aorta and abdominal aorta including significant mural thrombus/ plaque within the aneurysm sac.  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  Nodule of the lingula measuring 5 mm and nodule of the left lower lobe measuring 4 mm. Given the patient risk factors, CT follow-up in 6 months is recommended, as suggested by the updated Fleischner Society guidelines.  Diverticular disease without evidence of acute diverticulitis  Cardiac Studies   Left Heart Cath and Coronary Angiography 04/09/2016   Prox Cx lesion, 80 %stenosed. - Focal napkin ring calcified lesion. Would be difficult PCI target  Ost LM to Prox LAD - dense moderate calcification  Prox RCA lesion, 25 %stenosed - diffusely calcified  The left ventricular ejection fraction is 55-65% by visual estimate.  LV end diastolic pressure is normal.  Severe single vessel disease involving the proximal circumflex napkin ring lesion. This would be a very difficult target for PCI due to poor visualization, and calcification. Would be very difficult to advance equipment based on the angulation of the circumflex complex takeoff.  Recommendations:  At this point, I think the best option would be to proceed with abdominal aortic aneurysm surgery or to any difficult high-risk PCI the circumflex lesion. This would preclude the need for antiplatelet agent being held for surgery.  Would continue to titrate anti-hypertensive regimen and antianginals.   Can address the circumflex lesion in the future if not controlled with medical management. Diagnostic Diagram      Echo- 04/08/2016  Study Conclusions  - Left ventricle: The cavity size was normal. Wall thickness was increased in a pattern of mild LVH. Systolic function was mildly reduced. The estimated ejection fraction was in the range of 45% to 50%. Doppler parameters are consistent with abnormal left ventricular relaxation (grade 1 diastolic dysfunction). The E/e&' ratio is between 8-15, suggesting indeterminate LV filling pressure. - Left atrium: The atrium  was normal in size. - Atrial septum: There was increased thickness of the septum, consistent with lipomatous hypertrophy. - Inferior vena cava: The vessel was normal in size. The respirophasic diameter changes were in the normal range (>= 50%), consistent with normal central venous pressure.  Impressions:  - LVEF 45-50%, mild LVH, basal to mid inferior hypokinesis, diastolic dysfunction, indeterminate LV filling pressure, normal LA size, normal IVC.   Patient Profile     76 y.o.AA female followed by Dr Sudie BaileyKnowlton in [place] with no prior cardiac issues, with HTN sent from her PCP's office 04/05/16 with an 8 cm abdominal aortic aneurysm. Cardiology saw her pre op.  Echo showed an EF of 45% with a WMA. Cath done 04/09/16 showed pCFX Ca++ lesion- medical Rx. She underwent AAA R&G 04/12/16 and tolerated this well. Her B/P has been drifting up post op.   Assessment & Plan    AAA: S/P AAA R&G 04/12/16 by Dr. Edilia Bo.  CAD: Continue aggressive medical therapy for coronary artery disease. Continue Aspirin, optimize BP and  antianginal therapy. Continue Lipitor  80 mg daily. Cholesterol     124 HDL cholesterol   45 LDL    63 Triglycerides   16  Hypertension:  BP is not optimally controlled  BUt she has been off of her meds until today   Metoprolol 50 mg TID (HR 60s),  Looks like just started today  Had been off  ? Some rebound HTN  COntinue Losartan 100  Norvasc also added Would supplement with IV lopressor for now (not clear if cut absorption complete) Agree with gentle diuresis  This should help BP as well    Signed, Corine Shelter, PA-C  04/15/2016, 10:47 AM    Pt seen and examined  I agreee with findings and have amended PE and note above by L Kilroy  Will continue to follow .    Dietrich Pates

## 2016-04-15 NOTE — Clinical Social Work Note (Signed)
Clinical Social Work Assessment  Patient Details  Name: Taylor Long MRN: [id number] Date of Birth: 1940/12/03  Date of referral:  04/15/16               Reason for consult:  Discharge Planning                Permission sought to share information with:  Family Supports Permission granted to share information::  Yes, Verbal Permission Granted  Name::     Shley Dolby.  Agency::     Relationship::  son  Contact Information:  [phone]  Housing/Transportation Living arrangements for the past 2 months:  Apartment Source of Information:  Patient Patient Interpreter Needed:  None Criminal Activity/Legal Involvement Pertinent to Current Situation/Hospitalization:  No - Comment as needed Significant Relationships:  Adult Children Lives with:  Self Do you feel safe going back to the place where you live?  Yes Need for family participation in patient care:     Care giving concerns: Patient stated she lives in an apartment on her own  Social Worker assessment / plan:  Clinical Social Worker met patient at bedside to offer support and discuss discharge needs. Patient stated her son had offered for her to stay at his home but patient stated she did not want to be with all five of his kids. Patient stated she is agreeable to go to a SNF after discharge but would prefer to be in the [place] area. CSW to complete necessary paperwork and initate SNF search on patient behalf. CSW to follow up with patient once bed offers are available.  Employment status:  Disabled (Comment on whether or not currently receiving Disability) Insurance information:  Medicare PT Recommendations:  [place] / Referral to community resources:  [place]  Patient/Family's Response to care: Patient verbalized appreciation and understanding for CSW role and involvement in care. Patient agreeable with current discharge plan to SNF   Patient/Family's Understanding of and  Emotional Response to Diagnosis, Current Treatment, and Prognosis:  Patient with good understanding of current medical state and limitations around most recent hospitalization. Patient agreeable with SNF placement.   Emotional Assessment Appearance:  Appears younger than stated age Attitude/Demeanor/Rapport:  Other Affect (typically observed):  Pleasant Orientation:  Oriented to Place, Oriented to  Time, Oriented to Situation, Oriented to Self Alcohol / Substance use:  Not Applicable Psych involvement (Current and /or in the community):  No (Comment)  Discharge Needs  Concerns to be addressed:  No discharge needs identified Readmission within the last 30 days:  Yes Current discharge risk:  None Barriers to Discharge:  No Barriers Identified   Wende Neighbors, LCSW 04/15/2016, 5:18 PM

## 2016-04-16 LAB — GLUCOSE, CAPILLARY
GLUCOSE-CAPILLARY: 106 mg/dL — AB (ref 65–99)
GLUCOSE-CAPILLARY: 94 mg/dL (ref 65–99)
Glucose-Capillary: 109 mg/dL — ABNORMAL HIGH (ref 65–99)
Glucose-Capillary: 115 mg/dL — ABNORMAL HIGH (ref 65–99)
Glucose-Capillary: 130 mg/dL — ABNORMAL HIGH (ref 65–99)
Glucose-Capillary: 152 mg/dL — ABNORMAL HIGH (ref 65–99)

## 2016-04-16 LAB — BASIC METABOLIC PANEL
ANION GAP: 7 (ref 5–15)
BUN: 8 mg/dL (ref 6–20)
CALCIUM: 8.5 mg/dL — AB (ref 8.9–10.3)
CO2: 30 mmol/L (ref 22–32)
CREATININE: 1 mg/dL (ref 0.44–1.00)
Chloride: 98 mmol/L — ABNORMAL LOW (ref 101–111)
GFR, EST NON AFRICAN AMERICAN: 55 mL/min — AB (ref >60)
Glucose, Bld: 108 mg/dL — ABNORMAL HIGH (ref 65–99)
Potassium: 3.6 mmol/L (ref 3.5–5.1)
SODIUM: 135 mmol/L (ref 135–145)

## 2016-04-16 MED ORDER — PNEUMOCOCCAL VAC POLYVALENT 25 MCG/0.5ML IJ INJ: 0.5000 mL | INJECTION | INTRAMUSCULAR | Status: DC | PRN

## 2016-04-16 MED ORDER — POTASSIUM CHLORIDE CRYS ER 10 MEQ PO TBCR
20.0000 meq | EXTENDED_RELEASE_TABLET | Freq: Once | ORAL | Status: AC
  Administered 2016-04-16: 20 meq via ORAL
  Filled 2016-04-16: qty 2

## 2016-04-16 MED ORDER — POTASSIUM CHLORIDE CRYS ER 10 MEQ PO TBCR
20.0000 meq | EXTENDED_RELEASE_TABLET | Freq: Two times a day (BID) | ORAL | Status: AC
  Administered 2016-04-16 – 2016-04-17 (×3): 20 meq via ORAL
  Filled 2016-04-16 (×2): qty 2
  Filled 2016-04-16: qty 1

## 2016-04-16 MED ORDER — AMLODIPINE BESYLATE 5 MG PO TABS
5.0000 mg | ORAL_TABLET | Freq: Two times a day (BID) | ORAL | Status: DC
Start: 2016-04-16 — End: 2016-04-18
  Administered 2016-04-16 – 2016-04-17 (×3): 5 mg via ORAL
  Filled 2016-04-16 (×4): qty 1

## 2016-04-16 NOTE — Progress Notes (Signed)
Physical Therapy Treatment Patient Details Name: Taylor Long MRN: [id number] DOB: 09/17/1940 Today's Date: 04/16/2016    History of Present Illness Pt is a 76 y.o. female who presented to ED from PCP on 04/05/16 with abdominal pain. CT showed 8cm AAA. Cardiac cath 3/13 revealed severe single vessel disease. Pt now s/p abdominal aortic aneurysm repair on 3/16. PMH includes HTN.     PT Comments    Pt very pleasant with excellent progression with mobility today. Pt able to stand and pivot in room as well as walk in hallway without pain or LOB. Pt with slow gait and increased time for transfers but without physical assist. Pt repeatedly stating "that was wonderful" after her walk. Educated for HEP and progression.  HR 90 sats 93% on RA    Follow Up Recommendations  SNF;Supervision for mobility/OOB     Equipment Recommendations       Recommendations for Other Services       Precautions / Restrictions Precautions Precautions: Fall    Mobility  Bed Mobility               General bed mobility comments: in chair on arrival  Transfers Overall transfer level: Needs assistance   Transfers: Sit to/from Stand Sit to Stand: Min guard         General transfer comment: cues for hand placement  Ambulation/Gait Ambulation/Gait assistance: Min guard Ambulation Distance (Feet): 300 Feet Assistive device: Rolling walker (2 wheeled) Gait Pattern/deviations: Step-through pattern;Decreased stride length;Trunk flexed   Gait velocity interpretation: <1.8 ft/sec, indicative of risk for recurrent falls General Gait Details: cues for posture, position in RW, slow gait   Stairs            Wheelchair Mobility    Modified Rankin (Stroke Patients Only)       Balance Overall balance assessment: Needs assistance   Sitting balance-Leahy Scale: Good       Standing balance-Leahy Scale: Fair                      Cognition Arousal/Alertness:  Awake/alert Behavior During Therapy: WFL for tasks assessed/performed Overall Cognitive Status: Impaired/Different from baseline Area of Impairment: Orientation Orientation Level: Time                  Exercises General Exercises - Lower Extremity Long Arc Quad: AROM;15 reps;Both;Seated Hip ABduction/ADduction: AROM;15 reps;Both;Seated Hip Flexion/Marching: AROM;15 reps;Both;Seated    General Comments        Pertinent Vitals/Pain Pain Assessment: No/denies pain    Home Living                      Prior Function            PT Goals (current goals can now be found in the care plan section) Progress towards PT goals: Progressing toward goals    Frequency    Min 3X/week      PT Plan Current plan remains appropriate;Frequency needs to be updated    Co-evaluation             End of Session Equipment Utilized During Treatment: Gait belt Activity Tolerance: Patient tolerated treatment well Patient left: in chair;with call bell/phone within reach;with nursing/sitter in room Nurse Communication: Mobility status PT Visit Diagnosis: Difficulty in walking, not elsewhere classified (R26.2)     Time: [id number] PT Time Calculation (min) (ACUTE ONLY): 30 min  Charges:  $Gait Training: 8-22 mins $Therapeutic Exercise: 8-22 mins  G Codes:       Enedina Finner Sulayman Manning 2016/05/16, 12:51 PM Delaney Meigs, PT [phone]

## 2016-04-16 NOTE — Progress Notes (Signed)
Progress Note  Patient Name: Taylor Long Date of Encounter: 04/16/2016  Primary Cardiologist:   New  F/U in [place] office  Subjective   Breathing is OK  No CP    Inpatient Medications    Scheduled Meds: . amLODipine  5 mg Oral Daily  . chlorhexidine  15 mL Mouth Rinse BID  . docusate sodium  100 mg Oral Daily  . enoxaparin (LOVENOX) injection  40 mg Subcutaneous Q24H  . insulin aspart  0-9 Units Subcutaneous Q4H  . ketorolac  15 mg Intravenous Q6H  . losartan  100 mg Oral Daily  . mouth rinse  15 mL Mouth Rinse q12n4p  . metoprolol  5 mg Intravenous Q6H  . pantoprazole  40 mg Oral Daily  . pneumococcal 23 valent vaccine  0.5 mL Intramuscular Tomorrow-1000   Continuous Infusions: . dextrose 5 % and 0.45 % NaCl with KCl 20 mEq/L 75 mL/hr at 04/16/16 0700  . nitroGLYCERIN Stopped (04/15/16 1415)   PRN Meds: sodium chloride, acetaminophen **OR** acetaminophen, alum & mag hydroxide-simeth, bisacodyl, guaiFENesin-dextromethorphan, hydrALAZINE, HYDROmorphone (DILAUDID) injection, labetalol, magnesium sulfate 1 - 4 g bolus IVPB, metoprolol, ondansetron, phenol, potassium chloride   Vital Signs    Vitals:   04/16/16 0400 04/16/16 0500 04/16/16 0600 04/16/16 0700  BP: (!) 150/63 128/60 (!) 155/61 (!) 165/69  Pulse: 77 74 79 81  Resp: 18 17 16 18   Temp:      TempSrc:      SpO2: 98% 99% 98% 98%  Weight:      Height:        Intake/Output Summary (Last 24 hours) at 04/16/16 0758 Last data filed at 04/16/16 0700  Gross per 24 hour  Intake          3043.85 ml  Output             3300 ml  Net          -256.15 ml   Filed Weights   04/06/16 0020 04/09/16 0424 04/14/16 0600  Weight: 151 lb 11.2 oz (68.8 kg) 157 lb 4.8 oz (71.4 kg) 167 lb 1.7 oz (75.8 kg)    Telemetry    SR   - Personally Reviewed  ECG      Physical Exam   GEN: No acute distress.   Neck: No JVD Cardiac: RRR, no murmurs, rubs, or gallops.  Respiratory: Clear to auscultation  bilaterally. GI: Deferred   MS: No edema; No deformity. Neuro:  Nonfocal  Psych: Normal affect   Labs    Chemistry Recent Labs Lab 04/13/16 0303 04/14/16 0352 04/15/16 0345 04/16/16 0343  NA 136 132* 133* 135  K 4.3 4.1 4.2 3.6  CL 104 101 99* 98*  CO2 24 25 26 30   GLUCOSE 217* 150* 142* 108*  BUN 17 12 9 8   CREATININE 1.22* 0.99 0.97 1.00  CALCIUM 8.5* 8.6* 8.4* 8.5*  PROT 5.7*  --   --   --   ALBUMIN 3.0*  --   --   --   AST 25  --   --   --   ALT 19  --   --   --   ALKPHOS 47  --   --   --   BILITOT 0.8  --   --   --   GFRNONAA 43* 55* 57* 55*  GFRAA 50* >60 >60 >60  ANIONGAP 8 6 8 7      Hematology Recent Labs Lab 04/13/16 0303 04/14/16 0352 04/15/16 0345  WBC 8.0 12.7* 10.1  RBC 3.68* 3.21* 2.94*  HGB 9.8* 8.6* 8.0*  HCT 30.6* 26.6* 24.5*  MCV 83.2 82.9 83.3  MCH 26.6 26.8 27.2  MCHC 32.0 32.3 32.7  RDW 14.5 14.0 14.0  PLT 203 200 199    Cardiac EnzymesNo results for input(s): TROPONINI in the last 168 hours. No results for input(s): TROPIPOC in the last 168 hours.   BNPNo results for input(s): BNP, PROBNP in the last 168 hours.   DDimer No results for input(s): DDIMER in the last 168 hours.   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 EXAM: PORTABLE CHEST 1 VIEW COMPARISON:  04/13/2016 and earlier. FINDINGS: Portable AP semi upright view at 0531 hours. Enteric tube has been removed. Right IJ introducer sheath remains. Subtle curvilinear atelectasis at both lung bases simulates the appearance of lucency under the diaphragm. Stable to mildly improved lung volumes. Stable cardiac size and mediastinal contours. Calcified aortic atherosclerosis. No pneumothorax, pulmonary edema, pleural effusion or confluent pulmonary opacity. IMPRESSION: 1. Enteric tube removed. 2. Mild bibasilar  atelectasis. Electronically Signed   By: Odessa Fleming M.D.   On: 04/15/2016 07:17    Cardiac Studies    Patient Profile     76 y.o. female hx of HTN, AAA, CAD  Now s/p AAA repair.  Assessment & Plan    1  HTN  BP is improved from yesterday  Not optimal  I would recomm continuing IV lopressor until PO intake back to normal.  I would also recomm increasing amlodipine to 5 bid  Continue losartan  2  CAD No symptoms to sugg angina.      Signed, Dietrich Pates, MD  04/16/2016, 7:58 AM

## 2016-04-16 NOTE — Progress Notes (Signed)
   VASCULAR SURGERY ASSESSMENT & PLAN:  4 Days Post-Op s/p: Open repair of 8 cm juxtarenal AAA  GI/NUTRITION: Advnace to dull liquids  HYPERTENSION: Under better control. Appreciate Dr. Charlott Rakesoss's help.   Transfer to floor.  Ambulate.  Continue IS  Hypokalemia: supplement K. F/U K tomorrow.    SUBJECTIVE: No complaints. No nausea. Passing flatus  PHYSICAL EXAM: Vitals:   04/16/16 0400 04/16/16 0500 04/16/16 0600 04/16/16 0700  BP: (!) 150/63 128/60 (!) 155/61 (!) 165/69  Pulse: 77 74 79 81  Resp: 18 17 16 18   Temp:      TempSrc:      SpO2: 98% 99% 98% 98%  Weight:      Height:       Abd with good BS Lungs clear Palpable pedal pulses  LABS:  Lab Results  Component Value Date   CREATININE 1.00 04/16/2016   CBG (last 3)   Recent Labs  04/15/16 1950 04/15/16 2355 04/16/16 0341  GLUCAP 159* 102* 109*    Active Problems:   Palpitations   Hypokalemia   Hypertensive heart failure (HCC)   Abdominal aortic aneurysm (AAA) without rupture (HCC)   Hypertensive emergency   Exertional angina (HCC)   S/P cardiac catheterization:  (04/09/2016) a. Prox Cx lesion, 80% sten- focal napkin ring calcified lesion b. Ost LM to Prox LAD- dense mod calc  c. Prox RCA lesion, 25% sten- diffuse calc    CKD (chronic kidney disease), stage III   CAD (coronary artery disease)  Cari CarawayChris Dickson Beeper: [phone]: [phone] 04/16/2016

## 2016-04-17 LAB — GLUCOSE, CAPILLARY
GLUCOSE-CAPILLARY: 105 mg/dL — AB (ref 65–99)
GLUCOSE-CAPILLARY: 105 mg/dL — AB (ref 65–99)
GLUCOSE-CAPILLARY: 103 mg/dL — AB (ref 65–99)
Glucose-Capillary: 113 mg/dL — ABNORMAL HIGH (ref 65–99)
Glucose-Capillary: 123 mg/dL — ABNORMAL HIGH (ref 65–99)

## 2016-04-17 LAB — CBC
HCT: 27.7 % — ABNORMAL LOW (ref 36.0–46.0)
HEMOGLOBIN: 8.8 g/dL — AB (ref 12.0–15.0)
MCH: 26.4 pg (ref 26.0–34.0)
MCHC: 31.8 g/dL (ref 30.0–36.0)
MCV: 83.2 fL (ref 78.0–100.0)
Platelets: 317 10*3/uL (ref 150–400)
RBC: 3.33 MIL/uL — AB (ref 3.87–5.11)
RDW: 13.8 % (ref 11.5–15.5)
WBC: 7.1 10*3/uL (ref 4.0–10.5)

## 2016-04-17 LAB — BASIC METABOLIC PANEL
ANION GAP: 8 (ref 5–15)
BUN: 7 mg/dL (ref 6–20)
CHLORIDE: 102 mmol/L (ref 101–111)
CO2: 27 mmol/L (ref 22–32)
Calcium: 8.7 mg/dL — ABNORMAL LOW (ref 8.9–10.3)
Creatinine, Ser: 0.92 mg/dL (ref 0.44–1.00)
Glucose, Bld: 85 mg/dL (ref 65–99)
POTASSIUM: 3.8 mmol/L (ref 3.5–5.1)
Sodium: 137 mmol/L (ref 135–145)

## 2016-04-17 MED ORDER — ENSURE ENLIVE PO LIQD: 237.0000 mL | Freq: Two times a day (BID) | ORAL | Status: DC

## 2016-04-17 MED ORDER — METOPROLOL TARTRATE 50 MG PO TABS
50.0000 mg | ORAL_TABLET | Freq: Two times a day (BID) | ORAL | Status: DC
  Administered 2016-04-17 – 2016-04-19 (×5): 50 mg via ORAL
  Filled 2016-04-17 (×5): qty 1

## 2016-04-17 MED ORDER — OXYCODONE-ACETAMINOPHEN 5-325 MG PO TABS
1.0000 | ORAL_TABLET | ORAL | Status: DC | PRN
  Administered 2016-04-17: 2 via ORAL
  Filled 2016-04-17: qty 2

## 2016-04-17 NOTE — Progress Notes (Signed)
Progress Note  Patient Name: Taylor Long Date of Encounter: 04/17/2016  Primary Cardiologist:   Subjective   No CP  No SOB   Having diarrhea    Inpatient Medications    Scheduled Meds: . amLODipine  5 mg Oral BID  . docusate sodium  100 mg Oral Daily  . enoxaparin (LOVENOX) injection  40 mg Subcutaneous Q24H  . insulin aspart  0-9 Units Subcutaneous Q4H  . losartan  100 mg Oral Daily  . metoprolol  5 mg Intravenous Q6H  . pantoprazole  40 mg Oral Daily  . potassium chloride  20 mEq Oral BID   Continuous Infusions: . dextrose 5 % and 0.45 % NaCl with KCl 20 mEq/L 75 mL/hr at 04/17/16 0127  . nitroGLYCERIN Stopped (04/15/16 1415)   PRN Meds: sodium chloride, acetaminophen **OR** acetaminophen, alum & mag hydroxide-simeth, bisacodyl, guaiFENesin-dextromethorphan, hydrALAZINE, labetalol, magnesium sulfate 1 - 4 g bolus IVPB, ondansetron, oxyCODONE-acetaminophen, phenol, pneumococcal 23 valent vaccine, potassium chloride   Vital Signs    Vitals:   04/17/16 0700 04/17/16 0748 04/17/16 0800 04/17/16 0805  BP: (!) 164/65  (!) 176/64 114/66  Pulse: 79     Resp: (!) 21  17 (!) 25  Temp:  98.4 F (36.9 C)    TempSrc:  Oral    SpO2: 94%     Weight:      Height:        Intake/Output Summary (Last 24 hours) at 04/17/16 0855 Last data filed at 04/17/16 0700  Gross per 24 hour  Intake             2000 ml  Output             2550 ml  Net             -550 ml   Filed Weights   04/06/16 0020 04/09/16 0424 04/14/16 0600  Weight: 151 lb 11.2 oz (68.8 kg) 157 lb 4.8 oz (71.4 kg) 167 lb 1.7 oz (75.8 kg)    Telemetry    SR   - Personally Reviewed  ECG      Physical Exam   GEN: No acute distress.   Neck: No JVD Cardiac: RRR, no murmurs, rubs, or gallops.  Respiratory: Clear to auscultation bilaterally. GI: Mild tenderness , non-distended  MS: No edema; No deformity. Neuro:  Nonfocal  Psych: Normal affect   Labs    Chemistry Recent Labs Lab 04/13/16 0303   04/15/16 0345 04/16/16 0343 04/17/16 0224  NA 136  < > 133* 135 137  K 4.3  < > 4.2 3.6 3.8  CL 104  < > 99* 98* 102  CO2 24  < > 26 30 27   GLUCOSE 217*  < > 142* 108* 85  BUN 17  < > 9 8 7   CREATININE 1.22*  < > 0.97 1.00 0.92  CALCIUM 8.5*  < > 8.4* 8.5* 8.7*  PROT 5.7*  --   --   --   --   ALBUMIN 3.0*  --   --   --   --   AST 25  --   --   --   --   ALT 19  --   --   --   --   ALKPHOS 47  --   --   --   --   BILITOT 0.8  --   --   --   --   GFRNONAA 43*  < > 57* 55* >60  GFRAA 50*  < > >60 >60 >60  ANIONGAP 8  < > 8 7 8   < > = values in this interval not displayed.   Hematology Recent Labs Lab 04/14/16 0352 04/15/16 0345 04/17/16 0224  WBC 12.7* 10.1 7.1  RBC 3.21* 2.94* 3.33*  HGB 8.6* 8.0* 8.8*  HCT 26.6* 24.5* 27.7*  MCV 82.9 83.3 83.2  MCH 26.8 27.2 26.4  MCHC 32.3 32.7 31.8  RDW 14.0 14.0 13.8  PLT 200 199 317    Cardiac EnzymesNo results for input(s): TROPONINI in the last 168 hours. No results for input(s): TROPIPOC in the last 168 hours.   BNPNo results for input(s): BNP, PROBNP in the last 168 hours.   DDimer No results for input(s): DDIMER in the last 168 hours.   Radiology    No results found.  Cardiac Studies     Patient Profile     76 y.o. female s/p AAA repair  Hx of CAD and HTN    Assessment & Plan    1  HTN  WIll d/c IV lopressor  Start metoprolol 50 mg 2x per day  Continue amlodipine   2  CAD  No symptoms to sugg ischemia  3  Diarrhea  Need to follow    Signed, Dietrich Pates, MD  04/17/2016, 8:55 AM

## 2016-04-17 NOTE — Progress Notes (Signed)
   VASCULAR SURGERY ASSESSMENT & PLAN:  5 Days Open repair of 8 cm juxtarenal AAA  GI/NUTRITION: Advanace to regular diet.   HYPERTENSION: Under better control. Appreciate Dr. Charlott Rakesoss's help. Possible discharge tomorrow if BP controlled on po meds.   Awaiting bed on 2W.  Ambulate.  Lives alone, will need discharge planner to evaluate needs at home.   SUBJECTIVE: No complaints.   PHYSICAL EXAM: Vitals:   04/17/16 0300 04/17/16 0357 04/17/16 0400 04/17/16 0500  BP: (!) 143/86  (!) 158/71 (!) 143/59  Pulse:   85 81  Resp: (!) 29  20 17   Temp:  98.8 F (37.1 C)    TempSrc:  Oral    SpO2:   94% 91%  Weight:      Height:       Lungs clear Abdominal incision looks fine ABD: good BS Palpable pedal pulses.   LABS: Lab Results  Component Value Date   WBC 7.1 04/17/2016   HGB 8.8 (L) 04/17/2016   HCT 27.7 (L) 04/17/2016   MCV 83.2 04/17/2016   PLT 317 04/17/2016   Lab Results  Component Value Date   CREATININE 0.92 04/17/2016   CBG (last 3)   Recent Labs  04/16/16 1928 04/16/16 2337 04/17/16 0349  GLUCAP 94 130* 105*    Active Problems:   Palpitations   Hypokalemia   Hypertensive heart failure (HCC)   Abdominal aortic aneurysm (AAA) without rupture (HCC)   Hypertensive emergency   Exertional angina (HCC)   S/P cardiac catheterization:  (04/09/2016) a. Prox Cx lesion, 80% sten- focal napkin ring calcified lesion b. Ost LM to Prox LAD- dense mod calc  c. Prox RCA lesion, 25% sten- diffuse calc    CKD (chronic kidney disease), stage III   CAD (coronary artery disease)   Cari CarawayChris Vanilla Heatherington Beeper: [phone]: [phone] 04/17/2016

## 2016-04-17 NOTE — Progress Notes (Signed)
Patient admitted to 2W36, A&Ox4, VSS. Tele applied and CCMD notified.

## 2016-04-17 NOTE — Progress Notes (Signed)
Nutrition Follow Up  INTERVENTION:    Ensure Enlive po BID, each supplement provides 350 kcal and 20 grams of protein  NEW NUTRITION DIAGNOSIS:   Increased nutrient needs related to  (post op healing) as evidenced by estimated needs, ongoing  GOAL:   Patient will meet greater than or equal to 90% of their needs, progressing  MONITOR:   PO intake, Supplement acceptance, Skin, Weight trends, Labs, I & O's  ASSESSMENT:   76 y.o. female with history of HTN, seen by her primary care physician for a routine follow up visit and was found to have a pulsatile abdominal mass. She was sent to the emergency department and CT scan showed an 8 cm aneurysm.   Pt s/p procedures 3/16: LYSIS OF ADHESIONS OVERSEWING OF LEFT RENAL VEIN REPAIR OF ABDOMINAL AORTIC ANEURYSM  NGT to LIS discontinued 3/18  Currenlty on a Heart Healthy/Carbohydrate Modified diet. PO intake decreased to 40% per flowsheets (previously 70-100%). Prior to surgery pt was drinking oral nutrition supplements. Medications reviewed and include IV Magnesium. CBG's [id number].  Diet Order:  Diet heart healthy/carb modified Room service appropriate? Yes; Fluid consistency: Thin  Skin:  Reviewed, no issues  Last BM:  3/20  Height:   Ht Readings from Last 1 Encounters:  04/06/16 5\' 6"  (1.676 m)    Weight:   Wt Readings from Last 1 Encounters:  04/14/16 167 lb 1.7 oz (75.8 kg)    Ideal Body Weight:  59.09 kg  BMI:  Body mass index is 26.97 kg/m.  Estimated Nutritional Needs:   Kcal:  1600-1800  Protein:  80-90 grams  Fluid:  1.8 L/day  EDUCATION NEEDS:   No education needs identified at this time  [name] ChattersKatie Mckaila Duffus, RD, LDN Pager #: [phone] After-Hours Pager #: [phone]

## 2016-04-18 LAB — GLUCOSE, CAPILLARY
GLUCOSE-CAPILLARY: 126 mg/dL — AB (ref 65–99)
GLUCOSE-CAPILLARY: 92 mg/dL (ref 65–99)
GLUCOSE-CAPILLARY: 92 mg/dL (ref 65–99)
GLUCOSE-CAPILLARY: 95 mg/dL (ref 65–99)
Glucose-Capillary: 112 mg/dL — ABNORMAL HIGH (ref 65–99)

## 2016-04-18 LAB — LIPID PANEL
CHOLESTEROL: 121 mg/dL (ref 0–200)
HDL: 40 mg/dL — ABNORMAL LOW (ref >40)
LDL Cholesterol: 57 mg/dL (ref 0–99)
TRIGLYCERIDES: 120 mg/dL (ref <150)
Total CHOL/HDL Ratio: 3 RATIO
VLDL: 24 mg/dL (ref 0–40)

## 2016-04-18 MED ORDER — OXYCODONE-ACETAMINOPHEN 5-325 MG PO TABS: 1.0000 | ORAL_TABLET | Freq: Four times a day (QID) | ORAL | 0 refills | Status: DC | PRN

## 2016-04-18 MED ORDER — ROSUVASTATIN CALCIUM 20 MG PO TABS: 20.0000 mg | ORAL_TABLET | Freq: Every day | ORAL | 2 refills | Status: DC

## 2016-04-18 MED ORDER — AMLODIPINE BESYLATE 5 MG PO TABS: 5.0000 mg | ORAL_TABLET | Freq: Every day | ORAL | 2 refills | Status: DC

## 2016-04-18 MED ORDER — ROSUVASTATIN CALCIUM 10 MG PO TABS
20.0000 mg | ORAL_TABLET | Freq: Every day | ORAL | Status: DC
Start: 2016-04-18 — End: 2016-04-19
  Administered 2016-04-18: 20 mg via ORAL
  Filled 2016-04-18: qty 2

## 2016-04-18 MED ORDER — HYDROCHLOROTHIAZIDE 12.5 MG PO CAPS
12.5000 mg | ORAL_CAPSULE | Freq: Every day | ORAL | Status: DC
  Administered 2016-04-18 – 2016-04-19 (×2): 12.5 mg via ORAL
  Filled 2016-04-18 (×2): qty 1

## 2016-04-18 MED ORDER — AMLODIPINE BESYLATE 5 MG PO TABS: 5.0000 mg | ORAL_TABLET | Freq: Every day | ORAL | Status: DC

## 2016-04-18 MED ORDER — AMLODIPINE BESYLATE 5 MG PO TABS
5.0000 mg | ORAL_TABLET | Freq: Every day | ORAL | Status: DC
  Administered 2016-04-18: 5 mg via ORAL

## 2016-04-18 MED ORDER — METOPROLOL TARTRATE 50 MG PO TABS: 50.0000 mg | ORAL_TABLET | Freq: Two times a day (BID) | ORAL | 2 refills | Status: DC

## 2016-04-18 MED ORDER — HYDROCHLOROTHIAZIDE 25 MG PO TABS: 12.5000 mg | ORAL_TABLET | Freq: Every day | ORAL | Status: DC

## 2016-04-18 NOTE — Progress Notes (Signed)
Progress Note  Patient Name: Taylor Long Date of Encounter: 04/18/2016  Primary Cardiologist: New  Follow in [place]    Subjective   No CP  No SOB    Inpatient Medications    Scheduled Meds: . amLODipine  5 mg Oral BID  . docusate sodium  100 mg Oral Daily  . enoxaparin (LOVENOX) injection  40 mg Subcutaneous Q24H  . feeding supplement (ENSURE ENLIVE)  237 mL Oral BID BM  . insulin aspart  0-9 Units Subcutaneous Q4H  . losartan  100 mg Oral Daily  . metoprolol tartrate  50 mg Oral BID  . pantoprazole  40 mg Oral Daily   Continuous Infusions: . dextrose 5 % and 0.45 % NaCl with KCl 20 mEq/L Stopped (04/17/16 0900)  . nitroGLYCERIN Stopped (04/15/16 1415)   PRN Meds: sodium chloride, acetaminophen **OR** acetaminophen, alum & mag hydroxide-simeth, bisacodyl, guaiFENesin-dextromethorphan, hydrALAZINE, labetalol, magnesium sulfate 1 - 4 g bolus IVPB, ondansetron, oxyCODONE-acetaminophen, phenol, pneumococcal 23 valent vaccine, potassium chloride   Vital Signs    Vitals:   04/17/16 1554 04/17/16 1629 04/17/16 1957 04/18/16 0418  BP:  (!) 163/69 (!) 154/63 (!) 166/68  Pulse:   81 80  Resp:  20 18 20   Temp: 98 F (36.7 C) 97.5 F (36.4 C) 98.7 F (37.1 C) 98.7 F (37.1 C)  TempSrc: Oral Oral Oral Oral  SpO2:  100% 99% 96%  Weight:      Height:        Intake/Output Summary (Last 24 hours) at 04/18/16 0817 Last data filed at 04/18/16 0419  Gross per 24 hour  Intake              840 ml  Output                0 ml  Net              840 ml   Filed Weights   04/06/16 0020 04/09/16 0424 04/14/16 0600  Weight: 151 lb 11.2 oz (68.8 kg) 157 lb 4.8 oz (71.4 kg) 167 lb 1.7 oz (75.8 kg)    Telemetry    SR  - Personally Reviewed  ECG    Physical Exam   GEN: No acute distress.   Neck: No JVD Cardiac: RRR, no murmurs, rubs, or gallops.  Respiratory: Clear to auscultation bilaterally. GI: Soft, nontender, non-distended  MS: No edema; No deformity. Neuro:   Nonfocal  Psych: Normal affect   Labs    Chemistry Recent Labs Lab 04/13/16 0303  04/15/16 0345 04/16/16 0343 04/17/16 0224  NA 136  < > 133* 135 137  K 4.3  < > 4.2 3.6 3.8  CL 104  < > 99* 98* 102  CO2 24  < > 26 30 27   GLUCOSE 217*  < > 142* 108* 85  BUN 17  < > 9 8 7   CREATININE 1.22*  < > 0.97 1.00 0.92  CALCIUM 8.5*  < > 8.4* 8.5* 8.7*  PROT 5.7*  --   --   --   --   ALBUMIN 3.0*  --   --   --   --   AST 25  --   --   --   --   ALT 19  --   --   --   --   ALKPHOS 47  --   --   --   --   BILITOT 0.8  --   --   --   --  GFRNONAA 43*  < > 57* 55* >60  GFRAA 50*  < > >60 >60 >60  ANIONGAP 8  < > 8 7 8   < > = values in this interval not displayed.   Hematology Recent Labs Lab 04/14/16 0352 04/15/16 0345 04/17/16 0224  WBC 12.7* 10.1 7.1  RBC 3.21* 2.94* 3.33*  HGB 8.6* 8.0* 8.8*  HCT 26.6* 24.5* 27.7*  MCV 82.9 83.3 83.2  MCH 26.8 27.2 26.4  MCHC 32.3 32.7 31.8  RDW 14.0 14.0 13.8  PLT 200 199 317    Cardiac EnzymesNo results for input(s): TROPONINI in the last 168 hours. No results for input(s): TROPIPOC in the last 168 hours.   BNPNo results for input(s): BNP, PROBNP in the last 168 hours.   DDimer No results for input(s): DDIMER in the last 168 hours.   Radiology    No results found.  Cardiac Studies     Patient Profile     76 y.o. female post repair of AAA  Hx of HTN and CAD    Assessment & Plan    1  HTN  BP  Still up /down  Up at night Would continue metoprolol and losartan (on prior) Amlodipine in am Add back HCTZ at 1/2 tab WIll make sure she has f/u in clinic    2  s/p AAA repair    3  CAD  Cath on 3/13 showed 80% LCx lesion (napkin ring appearance)  No intervention done   Difficult region  Got through surgery oK  Recmm Control BP  Would also place on statin Crestor 20    4  GI  Diarrhea resolved    WIll make sure she has f/u next week or 2 in [place]    Signed, Dietrich Pates, MD  04/18/2016, 8:17 AM

## 2016-04-18 NOTE — Care Management Important Message (Signed)
Important Message  Patient Details  Name: Taylor Long MRN: [id number] Date of Birth: 01/09/1941   Medicare Important Message Given:  Yes    Kyla BalzarineShealy, Argenis Kumari Abena 04/18/2016, 12:51 PM

## 2016-04-18 NOTE — Progress Notes (Signed)
Patient ambulated with this RN in the hall using a rolling walker on room air, ambulation well tolerated with steady gait, patient now resting quietly on the bed call bell within reach will continue to monitor.

## 2016-04-18 NOTE — Progress Notes (Signed)
   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   HYPERTENSION: Under better control.  Home today vs SNF. If she goes home will need walker and HHPTx  Will need input from Cardiology on home HTN meds and then she will need close f/u with her PCP.  SUBJECTIVE: No complaints.   PHYSICAL EXAM: Vitals:   04/17/16 1554 04/17/16 1629 04/17/16 1957 04/18/16 0418  BP:  (!) 163/69 (!) 154/63 (!) 166/68  Pulse:   81 80  Resp:  20 18 20   Temp: 98 F (36.7 C) 97.5 F (36.4 C) 98.7 F (37.1 C) 98.7 F (37.1 C)  TempSrc: Oral Oral Oral Oral  SpO2:  100% 99% 96%  Weight:      Height:       Lungs clear Abd: normal BS Incision fine.   LABS: Lab Results  Component Value Date   WBC 7.1 04/17/2016   HGB 8.8 (L) 04/17/2016   HCT 27.7 (L) 04/17/2016   MCV 83.2 04/17/2016   PLT 317 04/17/2016   Lab Results  Component Value Date   CREATININE 0.92 04/17/2016    CBG (last 3)   Recent Labs  04/17/16 2041 04/18/16 0034 04/18/16 0414  GLUCAP 123* 95 92    Active Problems:   Palpitations   Hypokalemia   Hypertensive heart failure (HCC)   Abdominal aortic aneurysm (AAA) without rupture (HCC)   Hypertensive emergency   Exertional angina (HCC)   S/P cardiac catheterization:  (04/09/2016) a. Prox Cx lesion, 80% sten- focal napkin ring calcified lesion b. Ost LM to Prox LAD- dense mod calc  c. Prox RCA lesion, 25% sten- diffuse calc    CKD (chronic kidney disease), stage III   CAD (coronary artery disease)   Cari CarawayChris Zarif Rathje Beeper: [phone]: [phone] 04/18/2016

## 2016-04-19 LAB — CREATININE, SERUM
Creatinine, Ser: 1.15 mg/dL — ABNORMAL HIGH (ref 0.44–1.00)
GFR calc Af Amer: 53 mL/min — ABNORMAL LOW (ref >60)
GFR calc non Af Amer: 46 mL/min — ABNORMAL LOW (ref >60)

## 2016-04-19 MED ORDER — ASPIRIN EC 81 MG PO TBEC: 81.0000 mg | DELAYED_RELEASE_TABLET | Freq: Every day | ORAL | Status: AC

## 2016-04-19 MED ORDER — AMLODIPINE BESYLATE 5 MG PO TABS
5.0000 mg | ORAL_TABLET | Freq: Two times a day (BID) | ORAL | Status: DC
  Administered 2016-04-19: 5 mg via ORAL
  Filled 2016-04-19: qty 1

## 2016-04-19 MED ORDER — AMLODIPINE BESYLATE 5 MG PO TABS: 5.0000 mg | ORAL_TABLET | Freq: Two times a day (BID) | ORAL | 2 refills | Status: DC

## 2016-04-19 NOTE — Progress Notes (Addendum)
Patient in a stable condition, discharge education reviewed with patient she verbalised understanding, iv removed, tele dc ccmd notified, rolling walker delivered to room for home use, patient belongings at bedside, patient's son Perrin Smackron Imm will be driving patient home. Patient taken off the unit by this RN on a wheelchair accompanied by family to the main entrance.

## 2016-04-19 NOTE — Care Management Note (Signed)
Case Management Note Previous CM note initiated by [name], [name], RN--04/15/2016, 1:58 PM    Patient Details  Name: Taylor Long MRN: [id number] Date of Birth: 07/31/1940  Subjective/Objective:   Pt admitted with 8 cm abdominal aortic aneurysm- plan for OR on 3/16 for repair.  3/19 1359 [name] RN, BSN - POD 3 open repair of AAA. Starting clears, starting po hypertensive meds, weaning off ntg drip, NG tube is out. Will transfer to floor once off ntg drip.  Per pt eval rec snf, CSW aware.  NCM will cont to follow for dc needs.  3/20 Debbie Swist RN CM Addendum- Per CSW note, patient would prefer to DC to SNF because she does not want to stay with her son and his 5 kids. Prefers [facility], CSW facilitating SNF for DC.  17:25 Patient ambulated 300 feet with PT, CM will continue to follow.     04/17/16 13:20 Addendum [name] RN CM Spoke with patient, she states she does not want to go to her son's house who has 5 children, her other son [name] is coming from [place] on Friday and she states he will stay with he if SNF is not approved. CM requested clarification from CSW for SNF approval 3/21, prepared patient for possibility of going home at DC. Patient states she will need a RW. She would like a HH RN and HHA. [facility], stated she would like any provider that would take her insurance. Request made to [facility] liaison Alroy Dustdecia [phone], waiting to hear back. 3/21 15:50 [facility] and [facility] unable to accept at this time, [facility] clinical liaison Kandee KeenCory able to accept for RN HHA as backup to SNF.  3/21 16:00 Received update from CSW. Anticipate DC to home w/ HH through [facility]. Will need HH orders at DC and RW.                                  Action/Plan: Pt tx to 2W  Expected Discharge Date:  04/19/16               Expected Discharge Plan:  Home w Home Health Services  In-House Referral:  Clinical Social Work  Discharge planning Services  CM  Consult  Post Acute Care Choice:  Durable Medical Equipment, Home Health Choice offered to:  Patient  DME Arranged:  Walker rolling DME Agency:  [facility]  HH Arranged:  PT HH [facility]  Status of Service:  Completed, signed off  If discussed at Long Length of Stay Meetings, dates discussed:    Discharge Disposition: home with home health   Additional Comments:  04/19/16- 1100- Berkley Wrightsman RN, CM- pt for d/c home today- order for HHPT placed along with DME RW- [facility] and [facility] as previously noted by CM note- also notified Clydie BraunKaren [facility] for RW need- DME to be delivered to room prior to discharge.   Zenda AlpersWebster, AplinKristi Hall, RN 04/19/2016, 11:01 AM [phone]

## 2016-04-19 NOTE — Progress Notes (Signed)
   VASCULAR SURGERY ASSESSMENT & PLAN:   7 Days Post-Op s/p: Open repair of AAA  HYPERTENSION: BP still a little up, but ok for discharge today. Dr. Tenny Crawoss has recommended that she continue metoprolol and losartin which she was on. Also, amlodipine in AM and HCTZ. She has arranged follow up in [place].   OK for discharge home    SUBJECTIVE:   No complaints  PHYSICAL EXAM:   Vitals:   04/18/16 2212 04/18/16 2303 04/18/16 2354 04/19/16 0452  BP: (!) 177/68 (!) 172/61 (!) 142/65 (!) 158/60  Pulse: 76 79  77  Resp: 18   18  Temp: 98.9 F (37.2 C)   98.5 F (36.9 C)  TempSrc: Oral   Oral  SpO2: 97%   98%  Weight:      Height:       Incision looks fine Good BS  LABS:   Lab Results  Component Value Date   WBC 7.1 04/17/2016   HGB 8.8 (L) 04/17/2016   HCT 27.7 (L) 04/17/2016   MCV 83.2 04/17/2016   PLT 317 04/17/2016   Lab Results  Component Value Date   CREATININE 1.15 (H) 04/19/2016   Lab Results  Component Value Date   INR 1.21 04/12/2016   CBG (last 3)   Recent Labs  04/18/16 0757 04/18/16 1115 04/18/16 1649  GLUCAP 92 112* 126*    PROBLEM LIST:    Active Problems:   Palpitations   Hypokalemia   Hypertensive heart failure (HCC)   Abdominal aortic aneurysm (AAA) without rupture (HCC)   Hypertensive emergency   Exertional angina (HCC)   S/P cardiac catheterization:  (04/09/2016) a. Prox Cx lesion, 80% sten- focal napkin ring calcified lesion b. Ost LM to Prox LAD- dense mod calc  c. Prox RCA lesion, 25% sten- diffuse calc    CKD (chronic kidney disease), stage III   CAD (coronary artery disease)   CURRENT MEDS:   . amLODipine  5 mg Oral Daily  . docusate sodium  100 mg Oral Daily  . enoxaparin (LOVENOX) injection  40 mg Subcutaneous Q24H  . feeding supplement (ENSURE ENLIVE)  237 mL Oral BID BM  . hydrochlorothiazide  12.5 mg Oral Daily  . losartan  100 mg Oral Daily  . metoprolol tartrate  50 mg Oral BID  . pantoprazole  40 mg Oral Daily    . rosuvastatin  20 mg Oral q1800    Cari CarawayChris Sohaib Vereen Beeper: [phone] Office: [phone] 04/19/2016

## 2016-04-19 NOTE — Progress Notes (Addendum)
Physical Therapy Treatment Patient Details Name: Taylor Long MRN: [id number] DOB: 10/07/1940 Today's Date: 04/19/2016    History of Present Illness Pt is a 76 y.o. female who presented to ED from PCP on 04/05/16 with abdominal pain. CT showed 8cm AAA. Cardiac cath 3/13 revealed severe single vessel disease. Pt now s/p abdominal aortic aneurysm repair on 3/16. PMH includes HTN.     PT Comments    Pt very pleasant and standing at sink on arrival. Pt with greatly increased activity tolerance and gait with ability to walk 700' without RW and no LOB but pt feels more confidant on her own mobilizing with RW at this point. Pt educated for activity progression and HEP who is moving well and safe for return home.    Follow Up Recommendations  Home health PT     Equipment Recommendations  Rolling walker with 5" wheels    Recommendations for Other Services       Precautions / Restrictions Precautions Precautions: Fall Restrictions Weight Bearing Restrictions: No    Mobility  Bed Mobility               General bed mobility comments: standing on arrival, EOB end of session  Transfers Overall transfer level: Modified independent                  Ambulation/Gait Ambulation/Gait assistance: Modified independent (Device/Increase time) Ambulation Distance (Feet): 950 Feet Assistive device: Rolling walker (2 wheeled);None Gait Pattern/deviations: Step-through pattern;Decreased stride length   Gait velocity interpretation: Below normal speed for age/gender General Gait Details: cues for posture and position with RW use. Pt walked 250' with RW mod I and the rest without AD with supervision with only one partial LOB.    Stairs            Wheelchair Mobility    Modified Rankin (Stroke Patients Only)       Balance Overall balance assessment: Needs assistance           Standing balance-Leahy Scale: Good                      Cognition  Arousal/Alertness: Awake/alert Behavior During Therapy: WFL for tasks assessed/performed Overall Cognitive Status: Within Functional Limits for tasks assessed                      Exercises General Exercises - Lower Extremity Long Arc Quad: AROM;Both;Seated;20 reps Hip Flexion/Marching: AROM;Both;Seated;20 reps    General Comments        Pertinent Vitals/Pain Pain Assessment: No/denies pain    Home Living                      Prior Function            PT Goals (current goals can now be found in the care plan section) Progress towards PT goals: Progressing toward goals    Frequency           PT Plan Discharge plan needs to be updated    Co-evaluation             End of Session   Activity Tolerance: Patient tolerated treatment well Patient left: in bed;with call bell/phone within reach (pt denied sitting up in chair and preferred EOB) Nurse Communication: Mobility status PT Visit Diagnosis: Difficulty in walking, not elsewhere classified (R26.2)     Time: [id number] PT Time Calculation (min) (ACUTE ONLY): 17 min  Charges:  $Gait Training:  8-22 mins                    G Codes:       Ana Woodroof B Wyona Neils 05-16-16, 10:15 AM  Delaney Meigs, PT [phone]

## 2016-04-19 NOTE — Progress Notes (Signed)
Progress Note  Patient Name: Taylor Long Date of Encounter: 04/19/2016  Primary Cardiologist: Will follow in [place]   Subjective   No CP  No SOB    Inpatient Medications    Scheduled Meds: . amLODipine  5 mg Oral BID  . docusate sodium  100 mg Oral Daily  . enoxaparin (LOVENOX) injection  40 mg Subcutaneous Q24H  . feeding supplement (ENSURE ENLIVE)  237 mL Oral BID BM  . hydrochlorothiazide  12.5 mg Oral Daily  . losartan  100 mg Oral Daily  . metoprolol tartrate  50 mg Oral BID  . pantoprazole  40 mg Oral Daily  . rosuvastatin  20 mg Oral q1800   Continuous Infusions: . dextrose 5 % and 0.45 % NaCl with KCl 20 mEq/L Stopped (04/17/16 0900)  . nitroGLYCERIN Stopped (04/15/16 1415)   PRN Meds: sodium chloride, acetaminophen **OR** acetaminophen, alum & mag hydroxide-simeth, bisacodyl, guaiFENesin-dextromethorphan, labetalol, magnesium sulfate 1 - 4 g bolus IVPB, ondansetron, oxyCODONE-acetaminophen, phenol, pneumococcal 23 valent vaccine, potassium chloride   Vital Signs    Vitals:   04/18/16 2212 04/18/16 2303 04/18/16 2354 04/19/16 0452  BP: (!) 177/68 (!) 172/61 (!) 142/65 (!) 158/60  Pulse: 76 79  77  Resp: 18   18  Temp: 98.9 F (37.2 C)   98.5 F (36.9 C)  TempSrc: Oral   Oral  SpO2: 97%   98%  Weight:      Height:        Intake/Output Summary (Last 24 hours) at 04/19/16 0809 Last data filed at 04/18/16 1700  Gross per 24 hour  Intake              240 ml  Output                0 ml  Net              240 ml   Filed Weights   04/06/16 0020 04/09/16 0424 04/14/16 0600  Weight: 151 lb 11.2 oz (68.8 kg) 157 lb 4.8 oz (71.4 kg) 167 lb 1.7 oz (75.8 kg)    Telemetry    SR- Personally Reviewed  ECG     Physical Exam   GEN: No acute distress.   Neck: No JVD Cardiac: RRR, no murmurs, rubs, or gallops.  Respiratory: Clear to auscultation bilaterally. GI: Soft, nontender, non-distended  MS: No edema; No deformity. Neuro:  Nonfocal  Psych:  Normal affect   Labs    Chemistry Recent Labs Lab 04/13/16 0303  04/15/16 0345 04/16/16 0343 04/17/16 0224 04/19/16 0431  NA 136  < > 133* 135 137  --   K 4.3  < > 4.2 3.6 3.8  --   CL 104  < > 99* 98* 102  --   CO2 24  < > 26 30 27   --   GLUCOSE 217*  < > 142* 108* 85  --   BUN 17  < > 9 8 7   --   CREATININE 1.22*  < > 0.97 1.00 0.92 1.15*  CALCIUM 8.5*  < > 8.4* 8.5* 8.7*  --   PROT 5.7*  --   --   --   --   --   ALBUMIN 3.0*  --   --   --   --   --   AST 25  --   --   --   --   --   ALT 19  --   --   --   --   --  ALKPHOS 47  --   --   --   --   --   BILITOT 0.8  --   --   --   --   --   GFRNONAA 43*  < > 57* 55* >60 46*  GFRAA 50*  < > >60 >60 >60 53*  ANIONGAP 8  < > 8 7 8   --   < > = values in this interval not displayed.   Hematology Recent Labs Lab 04/14/16 0352 04/15/16 0345 04/17/16 0224  WBC 12.7* 10.1 7.1  RBC 3.21* 2.94* 3.33*  HGB 8.6* 8.0* 8.8*  HCT 26.6* 24.5* 27.7*  MCV 82.9 83.3 83.2  MCH 26.8 27.2 26.4  MCHC 32.3 32.7 31.8  RDW 14.0 14.0 13.8  PLT 200 199 317    Cardiac EnzymesNo results for input(s): TROPONINI in the last 168 hours. No results for input(s): TROPIPOC in the last 168 hours.   BNPNo results for input(s): BNP, PROBNP in the last 168 hours.   DDimer No results for input(s): DDIMER in the last 168 hours.   Radiology    No results found.  Cardiac Studies    Patient Profile     76 y.o. female CAD, HTN, HL  Now s/p repair of AAA  Assessment & Plan    1  HTN  BP is still up  I would recomm that amlodipine be inccreased to bid  Continue other meds   2  CAD  Cath ealier in admit with signif LCx lesion  Plan for risk factor management (HTN, lipids) Pt did get through surgery without any evid for active ischemia    3  HL  On statin now.  Will make sure that pt has f/u in [place] office.   Take BP at home   Signed, Dietrich Pates, MD  04/19/2016, 8:09 AM

## 2016-04-19 NOTE — Progress Notes (Addendum)
  Progress Note    04/19/2016 7:28 AM 7 Days Post-Op  Subjective:  Worried about her blood pressure; wants to go home  Afebrile HR  70's-80's NSR 140's-170's systolic 98% RA  Vitals:   04/18/16 2354 04/19/16 0452  BP: (!) 142/65 (!) 158/60  Pulse:  77  Resp:  18  Temp:  98.5 F (36.9 C)    Physical Exam: Lungs:  Non labored Abdomen:  Soft NT/ND; BM on Wed; -N/V  CBC    Component Value Date/Time   WBC 7.1 04/17/2016 0224   RBC 3.33 (L) 04/17/2016 0224   HGB 8.8 (L) 04/17/2016 0224   HCT 27.7 (L) 04/17/2016 0224   PLT 317 04/17/2016 0224   MCV 83.2 04/17/2016 0224   MCH 26.4 04/17/2016 0224   MCHC 31.8 04/17/2016 0224   RDW 13.8 04/17/2016 0224   LYMPHSABS 2.5 04/05/2016 1457   MONOABS 0.6 04/05/2016 1457   EOSABS 0.1 04/05/2016 1457   BASOSABS 0.0 04/05/2016 1457    BMET    Component Value Date/Time   NA 137 04/17/2016 0224   K 3.8 04/17/2016 0224   CL 102 04/17/2016 0224   CO2 27 04/17/2016 0224   GLUCOSE 85 04/17/2016 0224   BUN 7 04/17/2016 0224   CREATININE 1.15 (H) 04/19/2016 0431   CALCIUM 8.7 (L) 04/17/2016 0224   GFRNONAA 46 (L) 04/19/2016 0431   GFRAA 53 (L) 04/19/2016 0431    INR    Component Value Date/Time   INR 1.21 04/12/2016 1326     Intake/Output Summary (Last 24 hours) at 04/19/16 0728 Last data filed at 04/18/16 1700  Gross per 24 hour  Intake              480 ml  Output                0 ml  Net              480 ml     Assessment:  76 y.o. female is s/p:  1. Lysis of adhesions  2. Oversewing of left renal vein  3. Repair of juxtarenal, inflammatory, 8 cm abdominal aortic aneurysm with 16 mm Dacron graft  7 Days Post-Op  Plan: -pt doing well this morning--blood pressure was in the 170's last evening but 150's this am. -walked well yesterday with RN -discussed with pt that she can shower, but will need to have someone at the house with her when she showers.  She will have her children checking in on her. -glucose  well controlled - finger sticks d/c'd yesterday -spoke with Dr. Sloan Leiteross-she will review BP meds this morning and we will discharge her this morning. -cardiology handling follow up appointment for pt in [place].   -will add baby aspirin  Doreatha MassedSamantha Rhyne, PA-C [facility] [phone] 04/19/2016 7:28 AM

## 2016-04-22 ENCOUNTER — Telehealth: Payer: Self-pay | Admitting: Vascular Surgery

## 2016-04-22 ENCOUNTER — Encounter: Payer: Self-pay | Admitting: Vascular Surgery

## 2016-04-22 NOTE — Telephone Encounter (Signed)
Patient's son, Clifton Custardaron, called and left voice msg.  I tried to call him back for more information.  Patient states her son is already on his way back to [place] and needed a letter stating he was in [place] taking care of her.  I asked patient to have him call us back for more information.  Says he will have him call us back some time tomorrow.   [phone].  Trenell Concannon E., LPN.

## 2016-04-22 NOTE — Telephone Encounter (Signed)
-----   Message from Phillips Odorarol S Pullins, RN sent at 04/19/2016  3:35 PM EDT ----- Regarding: needs 2 week f/u with Dr. Edilia Boickson   ----- Message ----- From: Dara LordsSamantha J Rhyne, PA-C Sent: 04/18/2016   3:05 PM To: Vvs Charge Pool  s/p AAA repair.  f/u with [facility] in 2 weeks.

## 2016-04-22 NOTE — Telephone Encounter (Signed)
spoke to pt about appt for 4/18, verified address and mailed letter

## 2016-04-24 ENCOUNTER — Telehealth: Payer: Self-pay

## 2016-04-24 DIAGNOSIS — R112 Nausea with vomiting, unspecified: Secondary | ICD-10-CM

## 2016-04-24 MED ORDER — ONDANSETRON HCL 4 MG PO TABS: 4.0000 mg | ORAL_TABLET | Freq: Three times a day (TID) | ORAL | 0 refills | Status: DC | PRN

## 2016-04-24 NOTE — Discharge Summary (Signed)
[facility] Discharge Summary  Taylor Long 08/30/1940 76 y.o. 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 female who was seen by her primary care physician for a routine follow up visit and was found to have a pulsatile abdominal mass. She was sent to the emergency department and CT scan showed an 8 cm aneurysm. She was transferred here for vascular evaluation.  She denies abdominal pain or back pain. She does not describe any abdominal pain or back pain over the last year. There is no family history of aneurysmal disease in the family.  Her only significant medical history is hypertension which appears to be poorly controlled. She denies any history of diabetes, hypercholesterolemia, history of previous myocardial infarction, history of congestive heart failure or history of COPD.  Hospital Course:  The patient was admitted to the hospital and had cardiac pre-operative evaluation. An echocardiogram was obtained as well as renal aldosterone levels given uncontrolled hypertension. An ECG was repeated. Her ECHO revealed decreased LVEF to 45-50% with basal to mid hypokinesis. She also endorsed symptoms of progressive DOE, fatigue and decreased exercise tolerance. Given her focal wall motion abnormalities, a cardiac cath was scheduled for 04/09/16. This revealed severe proximal LCX lesion that although significant was not prohibitive for aneurysm repair.   Cardiology continued to manage her hypertension up until her surgery on 04/12/16.   The patient was taken to the operating room on 04/12/16 and underwent: 1. Lysis of adhesions  2. Oversewing of left renal vein  3. Repair of juxtarenal, inflammatory, 8  cm abdominal aortic aneurysm with 16 mm Dacron graft  The patient tolerated the procedure well and was transported to the PACU in stable condition.   She was placed on a nitroglycerin drip post-op for hypotension. She had a slight increase in her creatinine on POD 1 that returned to baseline on POD 2. Her NG tube was discontinued on POD 2. She was started on clear liquids on POD 3. She was weaning off of nitroglycerin. Cardiology supplemented with IV lopressor. She was stable for transfer to the floor on POD 4.   The remainder of her hospitalization consisted on titrating her BP meds, advancing her diet and increasing her mobilization. By POD 7, she was stable for d/c home. Her incisions were healing well and she had palpable pedal pulses. Follow-up was arranged with cardiology.    CBC    Component Value Date/Time   WBC 7.1 04/17/2016 0224   RBC 3.33 (L) 04/17/2016 0224   HGB 8.8 (L) 04/17/2016 0224   HCT 27.7 (L) 04/17/2016 0224   PLT 317 04/17/2016 0224   MCV 83.2 04/17/2016 0224   MCH 26.4 04/17/2016 0224   MCHC 31.8 04/17/2016 0224   RDW 13.8 04/17/2016 0224   LYMPHSABS 2.5 04/05/2016 1457   MONOABS 0.6 04/05/2016 1457   EOSABS 0.1 04/05/2016 1457   BASOSABS 0.0 04/05/2016 1457    BMET    Component Value Date/Time   NA 137 04/17/2016 0224   K 3.8 04/17/2016 0224   CL 102 04/17/2016 0224   CO2 27 04/17/2016 0224   GLUCOSE 85 04/17/2016 0224   BUN 7 04/17/2016 0224   CREATININE 1.15 (H) 04/19/2016 0431   CALCIUM 8.7 (L) 04/17/2016 0224   GFRNONAA  46 (L) 04/19/2016 0431   GFRAA 53 (L) 04/19/2016 0431     Discharge Instructions:   The patient is discharged to home with extensive instructions on wound care and progressive ambulation. They are instructed not to drive or perform any heavy lifting until returning to see the physician in his office.  Discharge Instructions    ABDOMINAL PROCEDURE/ANEURYSM REPAIR/AORTO-BIFEMORAL BYPASS:  Call MD for increased abdominal pain;  cramping diarrhea; nausea/vomiting    Complete by:  As directed    Call MD for:  redness, tenderness, or signs of infection (pain, swelling, bleeding, redness, odor or green/yellow discharge around incision site)    Complete by:  As directed    Call MD for:  severe or increased pain, loss or decreased feeling  in affected limb(s)    Complete by:  As directed    Call MD for:  temperature >100.5    Complete by:  As directed    Driving Restrictions    Complete by:  As directed    No driving for 2 weeks   Lifting restrictions    Complete by:  As directed    No lifting for 4 weeks   Resume previous diet    Complete by:  As directed       Discharge Diagnosis:  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 S/P cardiac catheterization:  (04/09/2016) a. Prox Cx lesion, 80% sten- focal napkin ring calcified lesion b. Ost LM to Prox LAD- dense mod calc  c. Prox RCA lesion, 25% sten- diffuse calc  04/10/2016  . CKD (chronic kidney disease), stage III 04/10/2016  . CAD (coronary artery disease) 04/10/2016  . Exertional angina (HCC)   . Abdominal aortic aneurysm (AAA) without rupture (HCC)   . Hypertensive emergency   . Hypokalemia 04/06/2016  . Hypertensive heart failure (HCC) 04/06/2016  . History of abdominal aortic aneurysm (AAA) 04/05/2016  . PELVIC  PAIN 10/07/2008  . DIABETES MELLITUS, TYPE II 08/10/2008  . HYPERLIPIDEMIA 07/13/2008  . TOBACCO ABUSE 07/13/2008  . ANXIETY 01/04/2008  . HYPERTENSION 01/04/2008  . LEG CRAMPS 01/04/2008  . Palpitations 01/04/2008   Past Medical History:  Diagnosis Date  . Hypertension      Allergies as of 04/19/2016      Reactions   Latex Rash      Medication List    STOP taking these medications   metoprolol succinate 25 MG 24 hr tablet Commonly known as:   TOPROL-XL   metoprolol succinate 50 MG 24 hr tablet Commonly known as:  TOPROL-XL     TAKE these medications   amLODipine 5 MG tablet Commonly known as:  NORVASC Take 1 tablet (5 mg total) by mouth 2 (two) times daily.   aspirin EC 81 MG tablet Take 1 tablet (81 mg total) by mouth daily.   hydrochlorothiazide 25 MG tablet Commonly known as:  HYDRODIURIL Take 0.5 tablets (12.5 mg total) by mouth daily. What changed:  how much to take   ibuprofen 200 MG tablet Commonly known as:  ADVIL,MOTRIN Take 200 mg by mouth every 6 (six) hours as needed for moderate pain.   LORazepam 0.5 MG tablet Commonly known as:  ATIVAN Take 0.25-0.5 mg by mouth 2 (two) times daily as needed for anxiety.   losartan 50 MG tablet Commonly known as:  COZAAR Take 1 tablet by mouth daily.  metoprolol 50 MG tablet Commonly known as:  LOPRESSOR Take 1 tablet (50 mg total) by mouth 2 (two) times daily. What changed:  additional instructions   ondansetron 4 MG tablet Commonly known as:  ZOFRAN Take 1 tablet (4 mg total) by mouth every 8 (eight) hours as needed for nausea or vomiting.   oxyCODONE-acetaminophen 5-325 MG tablet Commonly known as:  PERCOCET/ROXICET Take 1 tablet by mouth every 6 (six) hours as needed for severe pain.   rosuvastatin 20 MG tablet Commonly known as:  CRESTOR Take 1 tablet (20 mg total) by mouth daily at 6 PM.       Percocet #30 No Refill  Disposition: Home  Patient's condition: is Good  Follow up: 1. Dr. Edilia Bo in 2 weeks   Maris Berger, PA-C [facility] [phone] 04/24/2016  1:38 PM   - For VQI Registry use ---   Post-op:  Time to Extubation: [x ] In OR, [ ]  < 12 hrs, [ ]  12-24 hrs, [ ]  >=24 hrs Vasopressors Req. Post-op: No ICU Stay: 3 days Transfusion: No   MI: No, [ ]  Troponin only, [ ]  EKG or Clinical New Arrhythmia: No  Complications: CHF: No Resp failure: No, [ ]  Pneumonia, [ ]  Ventilator Chg in renal function:  No, [ ]  Inc. Cr > 0.5, [ ]  Temp. Dialysis, [ ]  Permanent dialysis Leg ischemia: No, no Surgery needed, [ ]  Yes, Surgery needed, [ ]  Amputation Bowel ischemia: No, [ ]  Medical Rx, [ ]  Surgical Rx Wound complication: No, [ ]  Superficial separation/infection, [ ]  Return to OR Return to OR: No  Return to OR for bleeding: No Stroke: No, [ ]  Minor, [ ]  Major  Discharge medications: Statin use:  Yes If No: [ ]  For Medical reasons, [ ]  Non-compliant ASA use:  Yes  If No: [ ]  For Medical reasons, [ ]  Non-compliant Plavix use:  No If No: [ ]  For Medical reasons, [ ]  Non-compliant Beta blocker use:  Yes If No: [ ]  For Medical reasons, [ ]  Non-compliant

## 2016-04-24 NOTE — Telephone Encounter (Signed)
[facility]  Reported the pt. c/o nausea.  Is requesting to have Zofran refilled.  Also stated the pt. Needs clarification on frequency of dosing on Losartan, and Metoprolol.    Call placed to pt.  Advised of D/C instructions for Losartan 50 mg daily, and Metoprolol 50 mg BID.  Questioned about nausea.  Reported nausea x 3 days.  Reported she vomited x 1 this morning.  Reported she has had one small BM since discharge.  Denied taking any Oxycodone.  Stated she is not having pain.  Denied any abdominal distension.  Stated her appetite hasn't been that good.  Discussed with Dr. Edilia Boickson.  Advised to hydrate and take Dulcolax tablet if needed for adequate BM.  Also advised can refill Zofran.  Phone call to pt. Advised of importance of hydration, and mobility for better bowel function.  Advised will refill Zofran, and encouraged to call office if symptoms don't improve, as she will need an appt. for further eval.  Verb. Understanding.

## 2016-05-03 ENCOUNTER — Encounter: Payer: Self-pay | Admitting: Adult Health

## 2016-05-03 ENCOUNTER — Ambulatory Visit (INDEPENDENT_AMBULATORY_CARE_PROVIDER_SITE_OTHER): Payer: Medicare Other | Admitting: Adult Health

## 2016-05-03 VITALS — BP 138/74 | HR 80 | Ht 66.0 in | Wt 147.0 lb

## 2016-05-03 DIAGNOSIS — I714 Abdominal aortic aneurysm, without rupture, unspecified: Secondary | ICD-10-CM

## 2016-05-03 DIAGNOSIS — I2510 Atherosclerotic heart disease of native coronary artery without angina pectoris: Secondary | ICD-10-CM

## 2016-05-03 DIAGNOSIS — I5043 Acute on chronic combined systolic (congestive) and diastolic (congestive) heart failure: Secondary | ICD-10-CM | POA: Diagnosis not present

## 2016-05-03 DIAGNOSIS — I10 Essential (primary) hypertension: Secondary | ICD-10-CM

## 2016-05-03 NOTE — Patient Instructions (Signed)
Medication Instructions:  Your physician recommends that you continue on your current medications as directed. Please refer to the Current Medication list given to you today.  YOU MAY TAKE A STOOL SOFTENER Labwork: NONE  Testing/Procedures: NONE  Follow-Up: Your physician recommends that you schedule a follow-up appointment in: 3 MONTHS    Any Other Special Instructions Will Be Listed Below (If Applicable).     If you need a refill on your cardiac medications before your next appointment, please call your pharmacy.

## 2016-05-03 NOTE — Progress Notes (Signed)
Cardiology Office Note   Date:  05/03/2016   ID:  Taylor Long, DOB [birth year], MRN [id number]  PCP:  Milana Obey, MD  Cardiologist: To be established in [place],  Taylor Reining, NP   Chief Complaint  Patient presents with  . Hospitalization Follow-up    AAA repair   . Hypertension      History of Present Illness: Taylor Long is a 76 y.o. female who presents for posthospitalization follow-up after admission for repair of 8 cm abdominal aortic aneurysm. Patient also has a history of hypertension, congestive heart failure class III, and palpitations. The patient was originally seen on 04/06/2016 by Dr. Sherryl Manges for preoperative evaluation.  The patient was scheduled for left heart cath on 04/09/2016 for evaluation of coronary artery status prior to AAA repair.  Conclusion     Prox Cx lesion, 80 %stenosed. - Focal napkin ring calcified lesion. Would be difficult PCI target  Ost LM to Prox LAD - dense moderate calcification  Prox RCA lesion, 25 %stenosed - diffusely calcified  The left ventricular ejection fraction is 55-65% by visual estimate.  LV end diastolic pressure is normal.   Severe single vessel disease involving the proximal circumflex napkin ring lesion. This would be a very difficult target for PCI due to poor visualization, and calcification. Would be very difficult to advance equipment based on the angulation of the circumflex complex takeoff.  Recommendations:  At this point, I think the best option would be to proceed with abdominal aortic aneurysm surgery or to any difficult high-risk PCI the circumflex lesion.  This would preclude the need for antiplatelet agent being held for surgery.  Would continue to titrate anti-hypertensive regimen and antianginals.    Can address the circumflex lesion in the future if not controlled with medical management.   The patient was seen by Dr. Waverly Ferrari, vascular surgeon for repair of AAA,  which extended up to the renal arteries. 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 She underwent repair of juxtarenal inflammatory 8 cm AAA with a 16 mm dacryon graft. This also included a lysis of adhesions and oversewing of left renal vein. The patient recovered well with normalization of blood pressure after a seven-day hospital stay. She was sent home on aspirin hydralazine losartan and metoprolol. There was no further intervention for coronary artery disease due to difficult location and access to the circumflex artery lesion.  She comes today with main complaint of lack of appetite. She states that the food does not taste good to her anymore since being discharged and placed on some much medication. She has lost approximately 20 pounds since being discharged from the hospital. She is drinking and short-term boost supplements but she states even those are causing her to have some mild nausea.  She is afraid to increase her activity level, and has not been outside very much because she has been depressed and having some low-grade nausea and lack of energy associated. She has not yet been contacted by cardiac rehabilitation.  Past Medical History:  Diagnosis Date  . Hypertension     Past Surgical History:  Procedure Laterality Date  . ABDOMINAL AORTIC ANEURYSM REPAIR N/A 04/12/2016   Procedure: ANEURYSM ABDOMINAL AORTIC REPAIR;  Surgeon: Chuck Hint, MD;  Location: [facility] OR;  Service: Vascular;  Laterality: N/A;  . LEFT HEART CATH AND CORONARY ANGIOGRAPHY N/A 04/09/2016   Procedure: Left Heart Cath and Coronary Angiography;  Surgeon: Marykay Lex, MD;  Location: [facility]  INVASIVE CV LAB;  Service: Cardiovascular;  Laterality: N/A;     Current Outpatient Prescriptions  Medication Sig Dispense Refill  . amLODipine (NORVASC) 5 MG tablet Take 1 tablet (5 mg total) by mouth 2 (two) times daily. 60 tablet 2  . aspirin EC 81 MG tablet Take 1 tablet (81 mg total) by mouth daily.     . hydrochlorothiazide (HYDRODIURIL) 25 MG tablet Take 0.5 tablets (12.5 mg total) by mouth daily.    Marland Kitchen ibuprofen (ADVIL,MOTRIN) 200 MG tablet Take 200 mg by mouth every 6 (six) hours as needed for moderate pain.    Marland Kitchen LORazepam (ATIVAN) 0.5 MG tablet Take 0.25-0.5 mg by mouth 2 (two) times daily as needed for anxiety.    Marland Kitchen losartan (COZAAR) 50 MG tablet Take 1 tablet by mouth daily.    . metoprolol (LOPRESSOR) 50 MG tablet Take 1 tablet (50 mg total) by mouth 2 (two) times daily. 60 tablet 2  . ondansetron (ZOFRAN) 4 MG tablet Take 1 tablet (4 mg total) by mouth every 8 (eight) hours as needed for nausea or vomiting. 10 tablet 0  . oxyCODONE-acetaminophen (PERCOCET/ROXICET) 5-325 MG tablet Take 1 tablet by mouth every 6 (six) hours as needed for severe pain. 30 tablet 0  . rosuvastatin (CRESTOR) 20 MG tablet Take 1 tablet (20 mg total) by mouth daily at 6 PM. 30 tablet 2   No current facility-administered medications for this visit.     Allergies:   Latex    Social History:  The patient  reports that she has quit smoking. She has never used smokeless tobacco. She reports that she does not drink alcohol or use drugs.   Family History:  The patient's family history includes Diabetes in her brother; Hypertension in her father, mother, and sister; Stroke in her father.    ROS: All other systems are reviewed and negative. Unless otherwise mentioned in H&P    PHYSICAL EXAM: VS:  BP 138/74   Pulse 80   Ht  (1.676 m)   Wt 147 lb (66.7 kg)   SpO2 96%   BMI 23.73 kg/m  , BMI Body mass index is 23.73 kg/m. GEN: Well nourished, well developed, in no acute distress  HEENT: normal  Neck: no JVD, carotid bruits, or masses Cardiac: RRR; no murmurs, rubs, or gallops,no edema  Respiratory:  clear to auscultation bilaterally, normal work of breathing GI: soft, nontender, nondistended, + BS MS: no deformity or atrophy large sternal incision to the pubis. Well healed without evidence of  infection or bleeding. Skin: warm and dry, no rash Neuro:  Strength and sensation are intact Psych: Depressed mood, full affect   Recent Labs: 03/18/2016: B Natriuretic Peptide 307.0 04/13/2016: ALT 19; Magnesium 1.8 04/17/2016: BUN 7; Hemoglobin 8.8; Platelets 317; Potassium 3.8; Sodium 137 04/19/2016: Creatinine, Ser 1.15    Lipid Panel    Component Value Date/Time   CHOL 121 04/18/2016 1023   TRIG 120 04/18/2016 1023   HDL 40 (L) 04/18/2016 1023   CHOLHDL 3.0 04/18/2016 1023   VLDL 24 04/18/2016 1023   LDLCALC 57 04/18/2016 1023      Wt Readings from Last 3 Encounters:  05/03/16 147 lb (66.7 kg)  04/14/16 167 lb 1.7 oz (75.8 kg)  03/18/16 153 lb (69.4 kg)      Other studies Reviewed: Echocardiogram 05-07-2016 Left ventricle: The cavity size was normal. Wall thickness was   increased in a pattern of mild LVH. Systolic function was mildly   reduced. The estimated  ejection fraction was in the range of 45%   to 50%. Doppler parameters are consistent with abnormal left   ventricular relaxation (grade 1 diastolic dysfunction). The E/e&'   ratio is between 8-15, suggesting indeterminate LV filling   pressure. - Left atrium: The atrium was normal in size. - Atrial septum: There was increased thickness of the septum,   consistent with lipomatous hypertrophy. - Inferior vena cava: The vessel was normal in size. The   respirophasic diameter changes were in the normal range (>= 50%),   consistent with normal central venous pressure.  ASSESSMENT AND PLAN:  1.  Abdominal aortic aneurysm: Status post surgical repair. She is recovering well concerning pain, an incision. She has not yet become any more active as she is been afraid to do anything that may cause problems with her surgical incision. She is due to see her vascular surgeon on 06/14/2016. Once she has been released will refer her to cardiac rehabilitation for assistance and increasing exercise, monitoring blood pressure,  and allowing for socialization which may help her to feel less isolated.  2. Coronary artery disease: Status post cardiac catheterization as above. She is not complaining of any recurrent chest discomfort or dyspnea. However she has been quite sedentary. We'll see her on close follow-up within one month at which time we will assess her status concerning chest discomfort with increased activity. It is my hopes that when she joined cardiac rehabilitation she will be able to feel stronger and be less afraid to be more active with in reason.  3. Hypertension: Blood pressure is controlled currently. She is on multiple medications. She believes this may be playing into her low-grade nausea and lack of appetite. May be able to titrate down some of these medications as she has been losing weight. We'll watch this closely to avoid hypotension and will have close follow-up.   Current medicines are reviewed at length with the patient today.    Labs/ tests ordered today include:  No orders of the defined types were placed in this encounter.    Disposition:   FU with one month  Signed, Taylor Reining, NP  05/03/2016 1:42 PM    [facility] [address] [place], [place] [address] Phone: [phone]; Fax: [phone]

## 2016-05-03 NOTE — Progress Notes (Signed)
Name: Taylor Long    DOB: 1940-05-27  Age: 76 y.o.  MR#: [id number]       PCP:  Milana Obey, MD      Insurance: Payor: Cleatrice Burke MEDICARE / Plan: [facility] MEDICARE / Product Type: *No Product type* /   CC:   No chief complaint on file.   VS Vitals:   05/03/16 1339  BP: 138/74  Pulse: 80  SpO2: 96%  Weight: 147 lb (66.7 kg)  Height: 5\' 6"  (1.676 m)    Weights Current Weight  05/03/16 147 lb (66.7 kg)  04/14/16 167 lb 1.7 oz (75.8 kg)  03/18/16 153 lb (69.4 kg)    Blood Pressure  BP Readings from Last 3 Encounters:  05/03/16 138/74  04/19/16 (!) 179/70  03/18/16 160/78     Admit date:  (Not on file) Last encounter with RMR:  Visit date not found   Allergy Latex  Current Outpatient Prescriptions  Medication Sig Dispense Refill  . amLODipine (NORVASC) 5 MG tablet Take 1 tablet (5 mg total) by mouth 2 (two) times daily. 60 tablet 2  . aspirin EC 81 MG tablet Take 1 tablet (81 mg total) by mouth daily.    . hydrochlorothiazide (HYDRODIURIL) 25 MG tablet Take 0.5 tablets (12.5 mg total) by mouth daily.    Marland Kitchen ibuprofen (ADVIL,MOTRIN) 200 MG tablet Take 200 mg by mouth every 6 (six) hours as needed for moderate pain.    Marland Kitchen LORazepam (ATIVAN) 0.5 MG tablet Take 0.25-0.5 mg by mouth 2 (two) times daily as needed for anxiety.    Marland Kitchen losartan (COZAAR) 50 MG tablet Take 1 tablet by mouth daily.    . metoprolol (LOPRESSOR) 50 MG tablet Take 1 tablet (50 mg total) by mouth 2 (two) times daily. 60 tablet 2  . ondansetron (ZOFRAN) 4 MG tablet Take 1 tablet (4 mg total) by mouth every 8 (eight) hours as needed for nausea or vomiting. 10 tablet 0  . oxyCODONE-acetaminophen (PERCOCET/ROXICET) 5-325 MG tablet Take 1 tablet by mouth every 6 (six) hours as needed for severe pain. 30 tablet 0  . rosuvastatin (CRESTOR) 20 MG tablet Take 1 tablet (20 mg total) by mouth daily at 6 PM. 30 tablet 2   No current facility-administered medications for this visit.     Discontinued Meds:    There are no discontinued medications.  Patient Active Problem List   Diagnosis Date Noted  . S/P cardiac catheterization:  (04/09/2016) a. Prox Cx lesion, 80% sten- focal napkin ring calcified lesion b. Ost LM to Prox LAD- dense mod calc  c. Prox RCA lesion, 25% sten- diffuse calc  04/10/2016  . CKD (chronic kidney disease), stage III 04/10/2016  . CAD (coronary artery disease) 04/10/2016  . Exertional angina (HCC)   . Abdominal aortic aneurysm (AAA) without rupture (HCC)   . Hypertensive emergency   . Hypokalemia 04/06/2016  . Hypertensive heart failure (HCC) 04/06/2016  . History of abdominal aortic aneurysm (AAA) 04/05/2016  . PELVIC  PAIN 10/07/2008  . DIABETES MELLITUS, TYPE II 08/10/2008  . HYPERLIPIDEMIA 07/13/2008  . TOBACCO ABUSE 07/13/2008  . ANXIETY 01/04/2008  . HYPERTENSION 01/04/2008  . LEG CRAMPS 01/04/2008  . Palpitations 01/04/2008    LABS    Component Value Date/Time   NA 137 04/17/2016 0224   NA 135 04/16/2016 0343   NA 133 (L) 04/15/2016 0345   K 3.8 04/17/2016 0224   K 3.6 04/16/2016 0343   K 4.2 04/15/2016 0345   CL 102  04/17/2016 0224   CL 98 (L) 04/16/2016 0343   CL 99 (L) 04/15/2016 0345   CO2 27 04/17/2016 0224   CO2 30 04/16/2016 0343   CO2 26 04/15/2016 0345   GLUCOSE 85 04/17/2016 0224   GLUCOSE 108 (H) 04/16/2016 0343   GLUCOSE 142 (H) 04/15/2016 0345   BUN 7 04/17/2016 0224   BUN 8 04/16/2016 0343   BUN 9 04/15/2016 0345   CREATININE 1.15 (H) 04/19/2016 0431   CREATININE 0.92 04/17/2016 0224   CREATININE 1.00 04/16/2016 0343   CALCIUM 8.7 (L) 04/17/2016 0224   CALCIUM 8.5 (L) 04/16/2016 0343   CALCIUM 8.4 (L) 04/15/2016 0345   GFRNONAA 46 (L) 04/19/2016 0431   GFRNONAA >60 04/17/2016 0224   GFRNONAA 55 (L) 04/16/2016 0343   GFRAA 53 (L) 04/19/2016 0431   GFRAA >60 04/17/2016 0224   GFRAA >60 04/16/2016 0343   CMP     Component Value Date/Time   NA 137 04/17/2016 0224   K 3.8 04/17/2016 0224   CL 102 04/17/2016 0224   CO2 27  04/17/2016 0224   GLUCOSE 85 04/17/2016 0224   BUN 7 04/17/2016 0224   CREATININE 1.15 (H) 04/19/2016 0431   CALCIUM 8.7 (L) 04/17/2016 0224   PROT 5.7 (L) 04/13/2016 0303   ALBUMIN 3.0 (L) 04/13/2016 0303   AST 25 04/13/2016 0303   ALT 19 04/13/2016 0303   ALKPHOS 47 04/13/2016 0303   BILITOT 0.8 04/13/2016 0303   GFRNONAA 46 (L) 04/19/2016 0431   GFRAA 53 (L) 04/19/2016 0431       Component Value Date/Time   WBC 7.1 04/17/2016 0224   WBC 10.1 04/15/2016 0345   WBC 12.7 (H) 04/14/2016 0352   HGB 8.8 (L) 04/17/2016 0224   HGB 8.0 (L) 04/15/2016 0345   HGB 8.6 (L) 04/14/2016 0352   HCT 27.7 (L) 04/17/2016 0224   HCT 24.5 (L) 04/15/2016 0345   HCT 26.6 (L) 04/14/2016 0352   MCV 83.2 04/17/2016 0224   MCV 83.3 04/15/2016 0345   MCV 82.9 04/14/2016 0352    Lipid Panel     Component Value Date/Time   CHOL 121 04/18/2016 1023   TRIG 120 04/18/2016 1023   HDL 40 (L) 04/18/2016 1023   CHOLHDL 3.0 04/18/2016 1023   VLDL 24 04/18/2016 1023   LDLCALC 57 04/18/2016 1023    ABG    Component Value Date/Time   PHART 7.355 04/12/2016 1043   PCO2ART 47.6 04/12/2016 1043   PO2ART 190.0 (H) 04/12/2016 1043   HCO3 26.6 04/12/2016 1043   TCO2 28 04/12/2016 1043   O2SAT 100.0 04/12/2016 1043     Lab Results  Component Value Date   TSH 1.829 01/05/2008   BNP (last 3 results)  Recent Labs  03/18/16 1135  BNP 307.0*    ProBNP (last 3 results) No results for input(s): PROBNP in the last 8760 hours.  Cardiac Panel (last 3 results) No results for input(s): CKTOTAL, CKMB, TROPONINI, RELINDX in the last 72 hours.  Iron/TIBC/Ferritin/ %Sat No results found for: IRON, TIBC, FERRITIN, IRONPCTSAT   EKG Orders placed or performed during the hospital encounter of 04/05/16  . EKG 12-Lead  . EKG 12-Lead  . EKG 12-Lead  . EKG 12-Lead     Prior Assessment and Plan Problem List as of 05/03/2016 Reviewed: 04/12/2016  7:30 AM by Joneen Caraway, CRNA     Cardiovascular and  Mediastinum   HYPERTENSION   Hypertensive heart failure (HCC)   Abdominal aortic aneurysm (AAA) without rupture (  HCC)   Hypertensive emergency   Exertional angina (HCC)   CAD (coronary artery disease)     Endocrine   DIABETES MELLITUS, TYPE II     Genitourinary   CKD (chronic kidney disease), stage III     Other   HYPERLIPIDEMIA   ANXIETY   TOBACCO ABUSE   LEG CRAMPS   Palpitations   PELVIC  PAIN   History of abdominal aortic aneurysm (AAA)   Hypokalemia   S/P cardiac catheterization:  (04/09/2016) a. Prox Cx lesion, 80% sten- focal napkin ring calcified lesion b. Ost LM to Prox LAD- dense mod calc  c. Prox RCA lesion, 25% sten- diffuse calc        Imaging: US Abdomen Complete  Result Date: 04/05/2016 CLINICAL DATA:  Upper abdominal pain for the past 1-2 days. EXAM: ABDOMEN ULTRASOUND COMPLETE COMPARISON:  None. FINDINGS: Gallbladder: There is a approximately 2.9 cm echogenic shadowing stone within the fundus of an otherwise normal-appearing gallbladder. No gallbladder wall thickening or pericholecystic fluid. Negative sonographic Murphy's sign. Common bile duct: Normal in size measuring 2.3 mm in diameter Liver: Homogeneous hepatic echotexture. No discrete hepatic lesions. No definite evidence of intrahepatic biliary ductal dilatation. No ascites. IVC: No abnormality visualized. Pancreas: Visualized portion unremarkable. Spleen: Normal in size measuring 2.9 cm in length Right Kidney: Normal cortical thickness, echogenicity and size, measuring 11.5 cm in length. No focal renal lesions. No echogenic renal stones. No urinary obstruction. Left Kidney: Normal cortical thickness, echogenicity and size, measuring 8.5 cm in length. No focal renal lesions. No echogenic renal stones. No urinary obstruction. Abdominal aorta: There is fusiform aneurysmal dilatation of the abdominal aorta measuring at least 8 cm in diameter with crescentic mural thrombus (image 103). The abdominal aorta appears to  taper to a normal caliber at the level the bifurcation, however visualization is difficult secondary to bowel gas. Other findings: None. IMPRESSION: 1. Cholelithiasis without evidence of cholecystitis. 2. 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 Further evaluation with CTA of the abdomen pelvis is recommended. Critical Value/emergent results were called by telephone at the time of interpretation on 04/05/2016 at 2:24 pm to Dr. Gareth Morgan , who verbally acknowledged these results. Electronically Signed   By: Simonne Come M.D.   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 EXAM: PORTABLE CHEST 1 VIEW COMPARISON:  04/13/2016 and earlier. FINDINGS: Portable AP semi upright view at 0531 hours. Enteric tube has been removed. Right IJ introducer sheath remains. Subtle curvilinear atelectasis at both lung bases simulates the appearance of lucency under the diaphragm. Stable to mildly improved lung volumes. Stable cardiac size and mediastinal contours. Calcified aortic atherosclerosis. No pneumothorax, pulmonary edema, pleural effusion or confluent pulmonary opacity. IMPRESSION: 1. Enteric tube removed. 2. Mild bibasilar atelectasis. Electronically Signed   By: Odessa Fleming M.D.   On: 04/15/2016 07:17   Dg Chest Port 1 View  Result Date: 04/13/2016 CLINICAL DATA:  Respiratory failure EXAM: PORTABLE CHEST 1 VIEW COMPARISON:  April 12, 2016 FINDINGS: Support apparatus is in good position. No pneumothorax. Mild opacity in the bases is slightly worsened. This is favored to represent atelectasis rather advanced developing infiltrate. No other interval changes. IMPRESSION: Mild increased bibasilar opacities may represent atelectasis. Early infiltrate is less likely. Recommend clinical correlation and attention on  follow-up. Electronically Signed  By: Gerome Sam III M.D   On: 04/13/2016 08:41   Dg Chest Port 1 View  Result Date: 04/12/2016 CLINICAL DATA:  Status post abdominal aortic aneurysm repair EXAM: PORTABLE CHEST 1 VIEW COMPARISON:  03/18/2016 FINDINGS: Cardiac shadow is mildly enlarged. Aortic calcifications are again seen. Nasogastric catheter and right jugular central line sheath are seen. The lungs are well aerated without focal infiltrate. No pneumothorax is seen. IMPRESSION: No acute abnormality noted. Tubes and lines as described above. Electronically Signed   By: Alcide Clever M.D.   On: 04/12/2016 12:17   Dg Abd Portable 1v  Result Date: 04/12/2016 CLINICAL DATA:  Status post abdominal aortic aneurysm repair EXAM: PORTABLE ABDOMEN - 1 VIEW COMPARISON:  04/05/2016 FINDINGS: Scattered large and small bowel gas is noted. Nasogastric catheter is noted in the decompressed stomach. Calcifications of the aorta are noted with significant aneurysmal dilatation similar to that seen on prior CT examination. No bony abnormality is noted. IMPRESSION: Changes consistent with the known abdominal aortic aneurysm. No other focal abnormality is seen. Electronically Signed   By: Alcide Clever M.D.   On: 04/12/2016 12:41   Ct Angio Chest/abd/pel For Dissection W And/or Wo Contrast  Result Date: 04/05/2016 CLINICAL DATA:  76 year old female with abdominal pain and abdominal aortic aneurysm. EXAM: CT CHEST, ABDOMEN, AND PELVIS WITH CONTRAST TECHNIQUE: Multidetector CT imaging of the chest, abdomen and pelvis was performed following the standard protocol during bolus administration of intravenous contrast. CONTRAST:  100 cc IV contrast COMPARISON:  None. FINDINGS: CT CHEST FINDINGS Cardiovascular: Heart: No cardiomegaly. No pericardial fluid/thickening. Calcifications of left main, left anterior descending, right coronary arteries. Aorta: Atherosclerotic changes of the thoracic aorta. No dissection flap. No periaortic  fluid. No aneurysm of the thoracic aorta. Significant irregular soft plaque of the descending thoracic aorta. Pulmonary arteries: No central, lobar, segmental, or proximal subsegmental filling defects. Mediastinum/Nodes: Mediastinal lymph nodes are present, none of which are enlarged by CT size criteria. Unremarkable appearance of the thoracic esophagus. Unremarkable appearance of the thoracic inlet and thyroid. Lungs/Pleura: No confluent airspace disease. 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 No pleural effusion. No significant and a bronchial thickening. 5 mm nodule of the lingula with no comparison. 4 mm nodule at the medial left lung base. No pneumothorax. Musculoskeletal: No displaced fracture. Degenerative changes of the spine. Review of the MIP images confirms the above findings. CT ABDOMEN PELVIS FINDINGS VASCULAR Aorta: Infrarenal abdominal aortic aneurysm with greatest diameter on the axial images measuring 8.0 cm. Mural calcifications are present with thickened aortic wall circumferentially beyond the mural calcifications. This is most pronounced at the level of the greatest dilation and extending inferiorly towards the aortic bifurcation. Mural thrombus present within the aneurysm sac approximately 75% of the circumference with flow lumen maintained. Diameter at the aorta at the renal arteries measures 2.5 cm with irregular plaque. No periaortic fluid.  No dissection flap. Celiac: Atherosclerotic changes at the origin of the celiac artery which remains patent, with likely 50% or greater stenosis. Accessory left hepatic artery. Splenic artery remains patent. SMA: Atherosclerotic changes at the origin of the superior mesenteric artery. No significant stenosis or occlusion. Replaced right hepatic artery. 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 Atherosclerotic changes at the origin of the right renal artery with at least 50%  stenosis. Partial opacification of the left renal artery, potentially secondary to collateral flow. IMA: Inferior mesenteric artery appears occluded at the origin. Right lower extremity: Atherosclerotic  changes extend from the aortic bifurcation to the right iliac artery. No aneurysm or dissection flap. Hypogastric artery remains patent. External iliac artery patent. Common femoral artery patent. Proximal femoral vasculature patent. Left lower extremity: Atherosclerotic changes extend from the bifurcation into the left common iliac artery. No significant stenosis or occlusion. No dissection. No iliac aneurysm. Hypogastric artery remains patent. External iliac artery patent. Common femoral artery patent including the proximal femoral system. Veins: Unremarkable appearance of the venous system. Review of the MIP images confirms the above findings. NON-VASCULAR Hepatobiliary: Unremarkable appearance of the liver. Unremarkable gall bladder. Pancreas: Unremarkable appearance of the pancreas. No pericholecystic fluid or inflammatory changes. Unremarkable ductal system. Spleen: Unremarkable. Adrenals/Urinary Tract: Bilateral adrenal glands unremarkable. Right kidney without hydronephrosis or nephrolithiasis. Low-density lesion at the inferior right cortex, incompletely characterized. No nephrolithiasis or perinephric fluid. Left kidney demonstrates no perfusion compared to the right. Trace perinephric stranding. Diameter of the left kidney is slightly reduced compared to the right. Left measures 8.1 cm and right measures 11.7 cm. Stomach/Bowel: Unremarkable appearance of stomach, small bowel. No abnormal distention. Colonic diverticula without evidence of acute diverticulitis. Appendix is not visualized, however, no inflammatory changes are present adjacent to the cecum to indicate an appendicitis. Lymphatic: Multiple lymph nodes in the para-aortic nodal station, none of which are enlarged. Mesenteric: No free fluid or  air. No adenopathy. Reproductive: Hysterectomy Other: No abdominal wall hernia. Musculoskeletal: No displaced fracture. Mild degenerative changes of the spine. No bony canal narrowing. Minimal degenerative changes of the hips. IMPRESSION: Although there are no specific findings of acute aortic syndrome, there is an infrarenal abdominal aortic aneurysm measuring 8 cm, and the aortic wall appears somewhat thickened/irregular, potentially inflammatory/reactive. Vascular consultation is recommended. 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 The left kidney measures 8.1 cm compared to the right measuring 11.7 cm. Although the chronicity uncertain, findings may represent acute renal artery thrombosis superimposed on chronic stenosis, and could represent a source of left-sided abdominal pain. The above results were called by telephone at the time of interpretation on 04/05/2016 at 5:31 pm to Dr. Samuel Jester , who verbally acknowledged these results. Aortic atherosclerosis with severe mural soft plaque throughout the length of the thoracic aorta and abdominal aorta including significant mural thrombus/ plaque within the aneurysm sac. 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 Nodule of the lingula measuring 5 mm and nodule of the left lower lobe measuring 4 mm. Given the patient risk factors, CT follow-up in 6 months is recommended, as suggested by the updated Fleischner Society guidelines. Diverticular disease without evidence of acute diverticulitis Signed, Yvone Neu. Loreta Ave, DO [facility] Electronically Signed   By: Gilmer Mor D.O.   On: 04/05/2016 17:32

## 2016-05-06 ENCOUNTER — Encounter: Payer: Self-pay | Admitting: Vascular Surgery

## 2016-05-14 ENCOUNTER — Telehealth: Payer: Self-pay | Admitting: *Deleted

## 2016-05-14 DIAGNOSIS — I714 Abdominal aortic aneurysm, without rupture, unspecified: Secondary | ICD-10-CM

## 2016-05-14 NOTE — Telephone Encounter (Signed)
-----   Message from Jodelle Gross, NP sent at 05/12/2016  4:25 PM EDT ----- Regarding: RE: Need MD signature   ----- Message ----- From: Rolene Course Sent: 05/12/2016   3:55 PM To: Jodelle Gross, NP Subject: Need MD signature                              Hello Samara Deist, We need one of the Cardi logist signature on this patients referral in EPIC before we can see her. Also she will have to go through Pulmonary rehab because her EF is too high for cardiac. It has to be 35% or below and it is 45-50% after echo. Can you please have referral signed. Thanks so much Diane Honeywell

## 2016-05-15 ENCOUNTER — Ambulatory Visit (INDEPENDENT_AMBULATORY_CARE_PROVIDER_SITE_OTHER): Payer: Self-pay | Admitting: Vascular Surgery

## 2016-05-15 ENCOUNTER — Encounter: Payer: Self-pay | Admitting: Vascular Surgery

## 2016-05-15 VITALS — BP 152/76 | HR 74 | Temp 98.0°F | Resp 20 | Ht 66.0 in | Wt 147.0 lb

## 2016-05-15 DIAGNOSIS — Z48812 Encounter for surgical aftercare following surgery on the circulatory system: Secondary | ICD-10-CM

## 2016-05-15 NOTE — Progress Notes (Signed)
   Patient name: Taylor Long MRN: [id number] DOB: 08/17/1940 Sex: female  REASON FOR VISIT: Follow up after repair of juxtarenal aneurysm  HPI: Taylor Long is a 76 y.o. female who presented with a juxtarenal 8 cm abdominal aortic aneurysm. 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 She underwent repair with a 16 mm Dacron graft. She comes in for her first outpatient visit.  She does have a poor appetite still but is overall doing well. She has been getting outside and increasing her activity gradually. She does not describe any claudication.  Current Outpatient Prescriptions  Medication Sig Dispense Refill  . amLODipine (NORVASC) 5 MG tablet Take 1 tablet (5 mg total) by mouth 2 (two) times daily. 60 tablet 2  . aspirin EC 81 MG tablet Take 1 tablet (81 mg total) by mouth daily.    . hydrochlorothiazide (HYDRODIURIL) 25 MG tablet Take 0.5 tablets (12.5 mg total) by mouth daily.    Marland Kitchen ibuprofen (ADVIL,MOTRIN) 200 MG tablet Take 200 mg by mouth every 6 (six) hours as needed for moderate pain.    Marland Kitchen LORazepam (ATIVAN) 0.5 MG tablet Take 0.25-0.5 mg by mouth 2 (two) times daily as needed for anxiety.    Marland Kitchen losartan (COZAAR) 50 MG tablet Take 1 tablet by mouth daily.    . metoprolol (LOPRESSOR) 50 MG tablet Take 1 tablet (50 mg total) by mouth 2 (two) times daily. 60 tablet 2  . ondansetron (ZOFRAN) 4 MG tablet Take 1 tablet (4 mg total) by mouth every 8 (eight) hours as needed for nausea or vomiting. 10 tablet 0  . rosuvastatin (CRESTOR) 20 MG tablet Take 1 tablet (20 mg total) by mouth daily at 6 PM. 30 tablet 2   No current facility-administered medications for this visit.     REVIEW OF SYSTEMS:   denotes positive finding,  denotes negative finding Cardiac  Comments:  Chest pain or chest pressure:    Shortness of breath upon exertion:    Short of breath when lying flat:    Irregular heart rhythm:    Constitutional    Fever or chills:      PHYSICAL  EXAM: Vitals:   05/15/16 1453  BP: (!) 152/76  Pulse: 74  Resp: 20  Temp: 98 F (36.7 C)  TempSrc: Oral  SpO2: 96%  Weight: 147 lb (66.7 kg)  Height:  (1.676 m)    GENERAL: The patient is a well-nourished female, in no acute distress. The vital signs are documented above. CARDIOVASCULAR: There is a regular rate and rhythm. PULMONARY: There is good air exchange bilaterally without wheezing or rales. She has palpable femoral pulses. She has palpable pedal pulses. Her abdominal incision is healing nicely.  MEDICAL ISSUES:  STATUS POST OPEN REPAIR OF JUXTARENAL INFLAMMATORY ABDOMINAL AORTIC ANEURYSM: Patient is doing well status post open repair of her aneurysm. I think her appetite should return soon. Overall pleased with her progress and I'll plan on seeing her back in 3 months. She knows to call sooner if she has problems.  Waverly Ferrari [facility] [phone]

## 2016-06-02 NOTE — Progress Notes (Signed)
Cardiology Office Note   Date:  06/03/2016   ID:  Taylor Long, DOB 02/09/1940, MRN [id number]  PCP:  Gareth MorganKnowlton, Steve, MD  Cardiologist:  To be established.  Chief Complaint  Patient presents with  . AAA  . Hypertension      History of Present Illness: Taylor Long is a 76 y.o. female who presents for ongoing assessment and management after repair of AAA, with other history to include hypertension, CHF with Class III dyspnea, and palpitations. Was last seen in the office on 05/03/2016. She is here for one month follow up. Should be able to be referred to cardiac rehab.   She comes today with complaints of nausea after taking Crestor. She states that when she doesn't take it, she is not nauseated. She sometime vomits. She is also complaining of generalized fatigue. She has not yet started cardiac rehab. She denies myalgia pain.    Past Medical History:  Diagnosis Date  . Hypertension     Past Surgical History:  Procedure Laterality Date  . ABDOMINAL AORTIC ANEURYSM REPAIR N/A 04/12/2016   Procedure: ANEURYSM ABDOMINAL AORTIC REPAIR;  Surgeon: Chuck Hinthristopher S Dickson, MD;  [facility];  Service: Vascular;  Laterality: N/A;  . LEFT HEART CATH AND CORONARY ANGIOGRAPHY N/A 04/09/2016   Procedure: Left Heart Cath and Coronary Angiography;  Surgeon: Marykay Lexavid W Harding, MD;  [facility];  Service: Cardiovascular;  Laterality: N/A;     Current Outpatient Prescriptions  Medication Sig Dispense Refill  . amLODipine (NORVASC) 5 MG tablet Take 1 tablet (5 mg total) by mouth 2 (two) times daily. 60 tablet 2  . aspirin EC 81 MG tablet Take 1 tablet (81 mg total) by mouth daily.    Marland Kitchen. atorvastatin (LIPITOR) 20 MG tablet Take 1 tablet (20 mg total) by mouth daily. 90 tablet 3  . hydrochlorothiazide (HYDRODIURIL) 25 MG tablet Take 0.5 tablets (12.5 mg total) by mouth daily.    Marland Kitchen. ibuprofen (ADVIL,MOTRIN) 200 MG tablet Take 200 mg by mouth every 6 (six) hours as needed for moderate  pain.    Marland Kitchen. LORazepam (ATIVAN) 0.5 MG tablet Take 0.25-0.5 mg by mouth 2 (two) times daily as needed for anxiety.    Marland Kitchen. losartan (COZAAR) 50 MG tablet Take 1 tablet by mouth daily.    . metoprolol (LOPRESSOR) 50 MG tablet Take 1 tablet (50 mg total) by mouth 2 (two) times daily. 60 tablet 2  . ondansetron (ZOFRAN) 4 MG tablet Take 1 tablet (4 mg total) by mouth every 8 (eight) hours as needed for nausea or vomiting. 10 tablet 0   No current facility-administered medications for this visit.     Allergies:   Crestor [rosuvastatin calcium] and Latex    Social History:  The patient  reports that she has quit smoking. She has never used smokeless tobacco. She reports that she does not drink alcohol or use drugs.   Family History:  The patient's family history includes Diabetes in her brother; Hypertension in her father, mother, and sister; Stroke in her father.    ROS: All other systems are reviewed and negative. Unless otherwise mentioned in H&P    PHYSICAL EXAM: VS:  BP 138/78   Pulse 70   Ht 5\' 6"  (1.676 m)   Wt 148 lb 6.4 oz (67.3 kg)   SpO2 97%   BMI 23.95 kg/m  , BMI Body mass index is 23.95 kg/m. GEN: Well nourished, well developed, in no acute distress  HEENT: normal  Neck: no JVD, carotid bruits, or masses Cardiac: RRR; no murmurs, rubs, or gallops,no edema  Respiratory:  clear to auscultation bilaterally, normal work of breathing GI: soft, nontender, nondistended, + BS MS: no deformity or atrophy Well healed sternotomy scar.  Skin: warm and dry, no rash Neuro:  Strength and sensation are intact Psych: euthymic mood, flat affect   Recent Labs: 03/18/2016: B Natriuretic Peptide 307.0 04/13/2016: ALT 19; Magnesium 1.8 04/17/2016: BUN 7; Hemoglobin 8.8; Platelets 317; Potassium 3.8; Sodium 137 04/19/2016: Creatinine, Ser 1.15    Lipid Panel    Component Value Date/Time   CHOL 121 04/18/2016 1023   TRIG 120 04/18/2016 1023   HDL 40 (L) 04/18/2016 1023   CHOLHDL 3.0  04/18/2016 1023   VLDL 24 04/18/2016 1023   LDLCALC 57 04/18/2016 1023      Wt Readings from Last 3 Encounters:  06/03/16 148 lb 6.4 oz (67.3 kg)  05/15/16 147 lb (66.7 kg)  05/03/16 147 lb (66.7 kg)      Other studies Reviewed: Echocardiogram Apr 18, 2016 Left ventricle: The cavity size was normal. Wall thickness was increased in a pattern of mild LVH. Systolic function was mildly reduced. The estimated ejection fraction was in the range of 45% to 50%. Doppler parameters are consistent with abnormal left ventricular relaxation (grade 1 diastolic dysfunction). The E/e&' ratio is between 8-15, suggesting indeterminate LV filling pressure. - Left atrium: The atrium was normal in size. - Atrial septum: There was increased thickness of the septum, consistent with lipomatous hypertrophy. - Inferior vena cava: The vessel was normal in size. The respirophasic diameter changes were in the normal range (>= 50%), consistent with normal central venous pressure. ASSESSMENT AND PLAN:  1. AAA: No complaints except for generalized fatigue. I am referring her to cardiac rehab. This may help with her overall stamina and provide socialization for her.   2.  Hypercholesterolemia: She is on high dose Crestor which is causing nausea when she takes it, sometimes throwing up. No myalgia pain. I will stop Crestor and start Lipitor 20 mg daily. She no longer wishes to take Crestor at all, even at lower dose. Check CMET.   3. Hypertension: BP is well controlled. She will continue current medications as directed.    Current medicines are reviewed at length with the patient today.    Labs/ tests ordered today include:   Orders Placed This Encounter  Procedures  . Comprehensive Metabolic Panel (CMET)     Disposition:   FU with 3 months   Signed, Joni Reining, NP  06/03/2016 1:54 PM    [facility] [address], [place], [place] [address] Phone: [phone]; Fax: [phone]

## 2016-06-03 ENCOUNTER — Other Ambulatory Visit (HOSPITAL_COMMUNITY)
Admission: RE | Admit: 2016-06-03 | Discharge: 2016-06-03 | Disposition: A | Discharge status: Home / Self Care | Payer: Medicare Other | Source: Ambulatory Visit | Attending: Adult Health | Admitting: Adult Health

## 2016-06-03 ENCOUNTER — Encounter: Payer: Self-pay | Admitting: Adult Health

## 2016-06-03 ENCOUNTER — Ambulatory Visit (INDEPENDENT_AMBULATORY_CARE_PROVIDER_SITE_OTHER): Payer: Medicare Other | Admitting: Adult Health

## 2016-06-03 VITALS — BP 138/78 | HR 70 | Ht 66.0 in | Wt 148.4 lb

## 2016-06-03 DIAGNOSIS — Z79899 Other long term (current) drug therapy: Secondary | ICD-10-CM | POA: Diagnosis not present

## 2016-06-03 DIAGNOSIS — I10 Essential (primary) hypertension: Secondary | ICD-10-CM

## 2016-06-03 DIAGNOSIS — E782 Mixed hyperlipidemia: Secondary | ICD-10-CM | POA: Insufficient documentation

## 2016-06-03 DIAGNOSIS — I714 Abdominal aortic aneurysm, without rupture, unspecified: Secondary | ICD-10-CM

## 2016-06-03 LAB — COMPREHENSIVE METABOLIC PANEL
ALBUMIN: 4.3 g/dL (ref 3.5–5.0)
ALK PHOS: 82 U/L (ref 38–126)
ALT: 17 U/L (ref 14–54)
ANION GAP: 11 (ref 5–15)
AST: 21 U/L (ref 15–41)
BILIRUBIN TOTAL: 0.6 mg/dL (ref 0.3–1.2)
BUN: 22 mg/dL — ABNORMAL HIGH (ref 6–20)
CALCIUM: 10 mg/dL (ref 8.9–10.3)
CO2: 30 mmol/L (ref 22–32)
Chloride: 97 mmol/L — ABNORMAL LOW (ref 101–111)
Creatinine, Ser: 1.01 mg/dL — ABNORMAL HIGH (ref 0.44–1.00)
GFR, EST NON AFRICAN AMERICAN: 54 mL/min — AB (ref >60)
Glucose, Bld: 104 mg/dL — ABNORMAL HIGH (ref 65–99)
POTASSIUM: 2.8 mmol/L — AB (ref 3.5–5.1)
Sodium: 138 mmol/L (ref 135–145)
TOTAL PROTEIN: 8 g/dL (ref 6.5–8.1)

## 2016-06-03 MED ORDER — ATORVASTATIN CALCIUM 20 MG PO TABS: 20.0000 mg | ORAL_TABLET | Freq: Every day | ORAL | 3 refills | Status: DC

## 2016-06-03 NOTE — Patient Instructions (Addendum)
Medication Instructions: STOP Crestor,  START  Lipitor  20 mg at dinner  Labwork: CMET  Procedures/Testing: NONE  Follow-Up: 3 months with Harriet PhoK Lawrence NP    Stop by cardiac Rehab today to schedule apt   If you need a refill on your cardiac medications before your next appointment, please call your pharmacy.    Thank you for choosing  [facility] !

## 2016-06-04 ENCOUNTER — Emergency Department (HOSPITAL_COMMUNITY)
Admission: EM | Admit: 2016-06-04 | Discharge: 2016-06-04 | Disposition: A | Discharge status: Home / Self Care | Payer: Medicare Other | Attending: Emergency Medicine | Admitting: Emergency Medicine

## 2016-06-04 ENCOUNTER — Encounter (HOSPITAL_COMMUNITY): Payer: Self-pay

## 2016-06-04 ENCOUNTER — Telehealth: Payer: Self-pay

## 2016-06-04 DIAGNOSIS — Z7982 Long term (current) use of aspirin: Secondary | ICD-10-CM | POA: Insufficient documentation

## 2016-06-04 DIAGNOSIS — I2510 Atherosclerotic heart disease of native coronary artery without angina pectoris: Secondary | ICD-10-CM | POA: Insufficient documentation

## 2016-06-04 DIAGNOSIS — E1122 Type 2 diabetes mellitus with diabetic chronic kidney disease: Secondary | ICD-10-CM | POA: Diagnosis not present

## 2016-06-04 DIAGNOSIS — E876 Hypokalemia: Secondary | ICD-10-CM | POA: Diagnosis not present

## 2016-06-04 DIAGNOSIS — N183 Chronic kidney disease, stage 3 (moderate): Secondary | ICD-10-CM | POA: Diagnosis not present

## 2016-06-04 DIAGNOSIS — Z87891 Personal history of nicotine dependence: Secondary | ICD-10-CM | POA: Diagnosis not present

## 2016-06-04 DIAGNOSIS — I130 Hypertensive heart and chronic kidney disease with heart failure and stage 1 through stage 4 chronic kidney disease, or unspecified chronic kidney disease: Secondary | ICD-10-CM | POA: Diagnosis not present

## 2016-06-04 DIAGNOSIS — Z791 Long term (current) use of non-steroidal anti-inflammatories (NSAID): Secondary | ICD-10-CM | POA: Insufficient documentation

## 2016-06-04 DIAGNOSIS — Z79899 Other long term (current) drug therapy: Secondary | ICD-10-CM | POA: Diagnosis not present

## 2016-06-04 DIAGNOSIS — R5383 Other fatigue: Secondary | ICD-10-CM | POA: Diagnosis present

## 2016-06-04 DIAGNOSIS — I509 Heart failure, unspecified: Secondary | ICD-10-CM | POA: Diagnosis not present

## 2016-06-04 LAB — CBC WITH DIFFERENTIAL/PLATELET
BASOS ABS: 0 10*3/uL (ref 0.0–0.1)
BASOS PCT: 0 %
EOS ABS: 0.1 10*3/uL (ref 0.0–0.7)
EOS PCT: 2 %
HCT: 36 % (ref 36.0–46.0)
Hemoglobin: 11.9 g/dL — ABNORMAL LOW (ref 12.0–15.0)
LYMPHS ABS: 1.8 10*3/uL (ref 0.7–4.0)
Lymphocytes Relative: 22 %
MCH: 26.8 pg (ref 26.0–34.0)
MCHC: 33.1 g/dL (ref 30.0–36.0)
MCV: 81.1 fL (ref 78.0–100.0)
MONO ABS: 0.8 10*3/uL (ref 0.1–1.0)
Monocytes Relative: 9 %
Neutro Abs: 5.4 10*3/uL (ref 1.7–7.7)
Neutrophils Relative %: 67 %
PLATELETS: 291 10*3/uL (ref 150–400)
RBC: 4.44 MIL/uL (ref 3.87–5.11)
RDW: 13.6 % (ref 11.5–15.5)
WBC: 8.1 10*3/uL (ref 4.0–10.5)

## 2016-06-04 LAB — BASIC METABOLIC PANEL
Anion gap: 9 (ref 5–15)
BUN: 24 mg/dL — AB (ref 6–20)
CHLORIDE: 96 mmol/L — AB (ref 101–111)
CO2: 31 mmol/L (ref 22–32)
CREATININE: 0.99 mg/dL (ref 0.44–1.00)
Calcium: 9.5 mg/dL (ref 8.9–10.3)
GFR calc Af Amer: >60 mL/min (ref >60)
GFR calc non Af Amer: 55 mL/min — ABNORMAL LOW (ref >60)
Glucose, Bld: 125 mg/dL — ABNORMAL HIGH (ref 65–99)
Potassium: 2.5 mmol/L — CL (ref 3.5–5.1)
Sodium: 136 mmol/L (ref 135–145)

## 2016-06-04 MED ORDER — POTASSIUM CHLORIDE CRYS ER 20 MEQ PO TBCR: 20.0000 meq | EXTENDED_RELEASE_TABLET | Freq: Two times a day (BID) | ORAL | 0 refills | Status: DC

## 2016-06-04 MED ORDER — POTASSIUM CHLORIDE 10 MEQ/100ML IV SOLN
10.0000 meq | Freq: Once | INTRAVENOUS | Status: AC
  Administered 2016-06-04: 10 meq via INTRAVENOUS
  Filled 2016-06-04: qty 100

## 2016-06-04 MED ORDER — POTASSIUM CHLORIDE CRYS ER 20 MEQ PO TBCR
40.0000 meq | EXTENDED_RELEASE_TABLET | Freq: Once | ORAL | Status: AC
  Administered 2016-06-04: 40 meq via ORAL
  Filled 2016-06-04: qty 2

## 2016-06-04 NOTE — Telephone Encounter (Signed)
-----   Message from Jodelle GrossKathryn M Lawrence, NP sent at 06/03/2016  4:43 PM EDT ----- Potassium is significantly low (2.8) . Will need to go to ER for IV replacement. Please call her and let her know. She is having a lot of vomiting on the Crestor which was stopped. May be the reason she is hypokalemic.

## 2016-06-04 NOTE — ED Provider Notes (Signed)
[facility]-EMERGENCY DEPT Provider Note   CSN: [id number] Arrival date & time: 06/04/16  1011  By signing my name below, I, Doreatha Martin, attest that this documentation has been prepared under the direction and in the presence of Bethann Berkshire, MD. Electronically Signed: Doreatha Martin, ED Scribe. 06/04/16. 12:19 PM.     History   Chief Complaint Chief Complaint  Patient presents with  . hypokalemia    HPI Taylor Long is a 76 y.o. female s/p AAA repair 04/12/16 who presents to the Emergency Department complaining of constant, gradually worsening generalized fatigue for 2 months. Pt was referred to the ED by her cardiologist, who drew labwork yesterday and found her potassium to be 2.8. No worsening or alleviating factors noted. She denies additional complaints.   The history is provided by the patient. No language interpreter was used.  Illness  This is a new problem. The current episode started more than 1 week ago. The problem occurs constantly. The problem has been gradually worsening. Pertinent negatives include no chest pain, no abdominal pain and no headaches. Nothing aggravates the symptoms. Nothing relieves the symptoms. She has tried nothing for the symptoms. The treatment provided no relief.    Past Medical History:  Diagnosis Date  . Hypercholesterolemia   . Hypertension     Patient Active Problem List   Diagnosis Date Noted  . S/P cardiac catheterization:  (04/09/2016) a. Prox Cx lesion, 80% sten- focal napkin ring calcified lesion b. Ost LM to Prox LAD- dense mod calc  c. Prox RCA lesion, 25% sten- diffuse calc  04/10/2016  . CKD (chronic kidney disease), stage III 04/10/2016  . CAD (coronary artery disease) 04/10/2016  . Exertional angina (HCC)   . Abdominal aortic aneurysm (AAA) without rupture (HCC)   . Hypertensive emergency   . Hypokalemia 04/06/2016  . Hypertensive heart failure (HCC) 04/06/2016  . History of abdominal aortic aneurysm (AAA) 04/05/2016  . PELVIC   PAIN 10/07/2008  . DIABETES MELLITUS, TYPE II 08/10/2008  . HYPERLIPIDEMIA 07/13/2008  . TOBACCO ABUSE 07/13/2008  . ANXIETY 01/04/2008  . HYPERTENSION 01/04/2008  . LEG CRAMPS 01/04/2008  . Palpitations 01/04/2008    Past Surgical History:  Procedure Laterality Date  . ABDOMINAL AORTIC ANEURYSM REPAIR N/A 04/12/2016   Procedure: ANEURYSM ABDOMINAL AORTIC REPAIR;  Surgeon: Chuck Hint, MD;  Location: [facility] OR;  Service: Vascular;  Laterality: N/A;  . LEFT HEART CATH AND CORONARY ANGIOGRAPHY N/A 04/09/2016   Procedure: Left Heart Cath and Coronary Angiography;  Surgeon: Marykay Lex, MD;  Location: [facility] INVASIVE CV LAB;  Service: Cardiovascular;  Laterality: N/A;    OB History    Gravida Para Term Preterm AB Living             4   SAB TAB Ectopic Multiple Live Births                   Home Medications    Prior to Admission medications   Medication Sig Start Date End Date Taking? Authorizing Provider  amLODipine (NORVASC) 5 MG tablet Take 1 tablet (5 mg total) by mouth 2 (two) times daily. 04/19/16  Yes Rhyne, Ames Coupe, PA-C  aspirin EC 81 MG tablet Take 1 tablet (81 mg total) by mouth daily. 04/19/16  Yes Rhyne, Ames Coupe, PA-C  hydrochlorothiazide (HYDRODIURIL) 25 MG tablet Take 0.5 tablets (12.5 mg total) by mouth daily. 04/18/16  Yes Rhyne, Ames Coupe, PA-C  ibuprofen (ADVIL,MOTRIN) 200 MG tablet Take 200 mg by mouth every  6 (six) hours as needed for moderate pain.   Yes [provider]  LORazepam (ATIVAN) 0.5 MG tablet Take 0.25-0.5 mg by mouth 2 (two) times daily as needed for anxiety. 03/08/16  Yes [provider]  losartan (COZAAR) 50 MG tablet Take 1 tablet by mouth daily. 02/02/16  Yes [provider]  metoprolol (LOPRESSOR) 50 MG tablet Take 1 tablet (50 mg total) by mouth 2 (two) times daily. 04/18/16  Yes Rhyne, Samantha J, PA-C  ondansetron (ZOFRAN) 4 MG tablet Take 1 tablet (4 mg total) by mouth every 8 (eight) hours as needed for nausea  or vomiting. 04/24/16  Yes Chuck Hintickson, Christopher S, MD  atorvastatin (LIPITOR) 20 MG tablet Take 1 tablet (20 mg total) by mouth daily. 06/03/16 09/01/16  Jodelle GrossLawrence, Kathryn M, NP  oxyCODONE-acetaminophen (PERCOCET/ROXICET) 5-325 MG tablet Take 1 tablet by mouth every 6 (six) hours as needed for severe pain.    [provider]    Family History Family History  Problem Relation Age of Onset  . Hypertension Mother   . Stroke Father   . Hypertension Father   . Hypertension Sister   . Diabetes Brother     Social History Social History  Substance Use Topics  . Smoking status: Former Games developermoker  . Smokeless tobacco: Never Used  . Alcohol use No     Allergies   Crestor [rosuvastatin calcium] and Latex   Review of Systems Review of Systems  Constitutional: Positive for fatigue. Negative for appetite change.  HENT: Negative for congestion, ear discharge and sinus pressure.   Eyes: Negative for discharge.  Respiratory: Negative for cough.   Cardiovascular: Negative for chest pain.  Gastrointestinal: Negative for abdominal pain and diarrhea.  Genitourinary: Negative for frequency and hematuria.  Musculoskeletal: Negative for back pain.  Skin: Negative for rash.  Neurological: Negative for seizures and headaches.  Psychiatric/Behavioral: Negative for hallucinations.     Physical Exam Updated Vital Signs BP (!) 153/67   Pulse 63   Temp 98.1 F (36.7 C) (Oral)   Resp 20   Ht 5\' 6"  (1.676 m)   Wt 148 lb (67.1 kg)   SpO2 98%   BMI 23.89 kg/m   Physical Exam  Constitutional: She is oriented to person, place, and time. She appears well-developed.  HENT:  Head: Normocephalic.  Eyes: Conjunctivae and EOM are normal. No scleral icterus.  Neck: Neck supple. No thyromegaly present.  Cardiovascular: Normal rate and regular rhythm.  Exam reveals no gallop and no friction rub.   No murmur heard. Pulmonary/Chest: No stridor. She has no wheezes. She has no rales. She exhibits no  tenderness.  Abdominal: She exhibits no distension. There is no tenderness. There is no rebound.  Well healed abdominal incision   Musculoskeletal: Normal range of motion. She exhibits no edema.  Lymphadenopathy:    She has no cervical adenopathy.  Neurological: She is oriented to person, place, and time. She exhibits normal muscle tone. Coordination normal.  Skin: No rash noted. No erythema.  Psychiatric: She has a normal mood and affect. Her behavior is normal.  Nursing note and vitals reviewed.    ED Treatments / Results   DIAGNOSTIC STUDIES: Oxygen Saturation is 98% on RA, normal by my interpretation.    COORDINATION OF CARE: 12:14 PM Discussed treatment plan with pt at bedside which includes labs and pt agreed to plan.    Labs (all labs ordered are listed, but only abnormal results are displayed) Labs Reviewed  BASIC METABOLIC PANEL - Abnormal;  Notable for the following:       Result Value   Potassium 2.5 (*)    Chloride 96 (*)    Glucose, Bld 125 (*)    BUN 24 (*)    GFR calc non Af Amer 55 (*)    All other components within normal limits    EKG  EKG Interpretation  Date/Time:  Tuesday Jun 04 2016 10:37:25 EDT Ventricular Rate:  65 PR Interval:    QRS Duration: 98 QT Interval:  389 QTC Calculation: 405 R Axis:   61 Text Interpretation:  Sinus rhythm Borderline T abnormalities, diffuse leads Confirmed by Ethelda Chick  MD, SAM ([id number]) on 06/04/2016 10:46:59 AM       Radiology No results found.  Procedures Procedures (including critical care time)  Medications Ordered in ED Medications - No data to display   Initial Impression / Assessment and Plan / ED Course  I have reviewed the triage vital signs and the nursing notes.  Pertinent labs & imaging results that were available during my care of the patient were reviewed by me and considered in my medical decision making (see chart for details). CRITICAL CARE Performed by: Charis Juliana L Total critical  care time: 35 minutes Critical care time was exclusive of separately billable procedures and treating other patients. Critical care was necessary to treat or prevent imminent or life-threatening deterioration. Critical care was time spent personally by me on the following activities: development of treatment plan with patient and/or surrogate as well as nursing, discussions with consultants, evaluation of patient's response to treatment, examination of patient, obtaining history from patient or surrogate, ordering and performing treatments and interventions, ordering and review of laboratory studies, ordering and review of radiographic studies, pulse oximetry and re-evaluation of patient's condition.     Patient has hypokalemia. I told patient she should be admitted overnight and given potassium. I told her that she could have significant cardiac problems from it. She refused to be admitted and will follow-up with her PCP  Final Clinical Impressions(s) / ED Diagnoses   Final diagnoses:  None    New Prescriptions New Prescriptions   No medications on file   The chart was scribed for me under my direct supervision.  I personally performed the history, physical, and medical decision making and all procedures in the evaluation of this patient.Bethann Berkshire, MD 06/04/16 [phone]

## 2016-06-04 NOTE — ED Notes (Signed)
Unable to get blood work from IV site.

## 2016-06-04 NOTE — ED Triage Notes (Signed)
Cardiology office called and reports pt's potassium was 2.8.  Reports pt was vomiting as a side effect of crestor.  Pt says she stopped the crestor 2 weeks ago and vomiting subsided.  Pt had blood work yesterday and was called and told to come to er for potassium today.  Pt denies any pain, says she just has no energy.

## 2016-06-04 NOTE — Discharge Instructions (Signed)
Follow up with your md next week or return sooner

## 2016-06-04 NOTE — Telephone Encounter (Signed)
Patient will [facility] to [facility] in [facility].She states she will get there as soon as she can.I will  Notify ED. Verlon AuLeslie, Charge nurse aware.

## 2016-06-04 NOTE — ED Notes (Signed)
CRITICAL VALUE ALERT  Critical value received:  K 2.5  Date of notification:  06/04/2016  Time of notification:  1158  Critical value read back:Yes.    Nurse who received alert:  [facility] RN  MD notified (1st page):  Dr. Ethelda ChickJacubowitz  Time of first page:  1158  MD notified (2nd page):  Time of second page:  Responding MD:  Dr. Ethelda ChickJacubowitz  Time MD responded:  [phone]

## 2016-07-01 NOTE — Addendum Note (Signed)
Addendum  created 07/01/16 1228 by Zafir Schauer, MD   Sign clinical note    

## 2016-07-01 NOTE — Anesthesia Postprocedure Evaluation (Signed)
Anesthesia Post Note  Patient: Taylor Long  Procedure(s) Performed: Procedure(s) (LRB): ANEURYSM ABDOMINAL AORTIC REPAIR (N/A)     Anesthesia Post Evaluation  Last Vitals:  Vitals:   04/19/16 0452 04/19/16 0923  BP: (!) 158/60 (!) 179/70  Pulse: 77 84  Resp: 18 18  Temp: 36.9 C 36.7 C    Last Pain:  Vitals:   04/19/16 0923  TempSrc: Oral  PainSc:                  Tawn Fitzner S

## 2016-08-07 ENCOUNTER — Encounter: Payer: Self-pay | Admitting: Vascular Surgery

## 2016-08-21 ENCOUNTER — Encounter: Payer: Self-pay | Admitting: Vascular Surgery

## 2016-08-21 ENCOUNTER — Ambulatory Visit (INDEPENDENT_AMBULATORY_CARE_PROVIDER_SITE_OTHER): Payer: Medicare Other | Admitting: Vascular Surgery

## 2016-08-21 VITALS — BP 157/79 | HR 64 | Temp 97.8°F | Resp 16 | Ht 66.0 in | Wt 156.9 lb

## 2016-08-21 DIAGNOSIS — Z48812 Encounter for surgical aftercare following surgery on the circulatory system: Secondary | ICD-10-CM

## 2016-08-21 NOTE — Progress Notes (Signed)
   Patient name: Taylor Long MRN: [id number] DOB: 06/05/1940 Sex: female  REASON FOR VISIT:    Follow up  HPI:   Taylor Long is a pleasant 76 y.o. female who underwent open abdominal aortic aneurysm repair on 04/12/2016. She required lysis of adhesions, and repair of a juxtarenal, inflammatory, 8 cm abdominal aortic aneurysm with a 16 mm tube graft. I last saw her on 05/15/2016. She was doing well at that time and comes in for a follow up visit.  She has no complaints. She denies nausea or vomiting. She denies claudication, rest pain, or nonhealing ulcers.  She has been off of cigarettes for 7 months.  Current Outpatient Prescriptions  Medication Sig Dispense Refill  . amLODipine (NORVASC) 5 MG tablet Take 1 tablet (5 mg total) by mouth 2 (two) times daily. 60 tablet 2  . aspirin EC 81 MG tablet Take 1 tablet (81 mg total) by mouth daily.    Marland Kitchen. atorvastatin (LIPITOR) 20 MG tablet Take 1 tablet (20 mg total) by mouth daily. 90 tablet 3  . hydrochlorothiazide (HYDRODIURIL) 25 MG tablet Take 0.5 tablets (12.5 mg total) by mouth daily.    Marland Kitchen. ibuprofen (ADVIL,MOTRIN) 200 MG tablet Take 200 mg by mouth every 6 (six) hours as needed for moderate pain.    Marland Kitchen. LORazepam (ATIVAN) 0.5 MG tablet Take 0.25-0.5 mg by mouth 2 (two) times daily as needed for anxiety.    Marland Kitchen. losartan (COZAAR) 50 MG tablet Take 1 tablet by mouth daily.    . metoprolol (LOPRESSOR) 50 MG tablet Take 1 tablet (50 mg total) by mouth 2 (two) times daily. 60 tablet 2  . ondansetron (ZOFRAN) 4 MG tablet Take 1 tablet (4 mg total) by mouth every 8 (eight) hours as needed for nausea or vomiting. 10 tablet 0  . oxyCODONE-acetaminophen (PERCOCET/ROXICET) 5-325 MG tablet Take 1 tablet by mouth every 6 (six) hours as needed for severe pain.    . potassium chloride SA (K-DUR,KLOR-CON) 20 MEQ tablet Take 1 tablet (20 mEq total) by mouth 2 (two) times daily. 14 tablet 0   No current facility-administered medications for this visit.      REVIEW OF SYSTEMS:  [X]  denotes positive finding, [ ]  denotes negative finding Cardiac  Comments:  Chest pain or chest pressure:    Shortness of breath upon exertion:    Short of breath when lying flat:    Irregular heart rhythm:    Constitutional    Fever or chills:     PHYSICAL EXAM:   Vitals:   08/21/16 1402  BP: (!) 157/79  Pulse: 64  Resp: 16  Temp: 97.8 F (36.6 C)  TempSrc: Oral  SpO2: 99%  Weight: 156 lb 14.4 oz (71.2 kg)  Height: 5\' 6"  (1.676 m)    GENERAL: The patient is a well-nourished female, in no acute distress. The vital signs are documented above. CARDIOVASCULAR: There is a regular rate and rhythm. PULMONARY: There is good air exchange bilaterally without wheezing or rales. ABDOMEN: Her incision has healed nicely. VASCULAR: She has palpable femoral, popliteal, dorsalis pedis, and posterior tibial pulses bilaterally.  DATA:   No new data  MEDICAL ISSUES:   STATUS POST OPEN REPAIR OF JUXTARENAL, INFLAMMATORY, ABDOMINAL AORTIC ANEURYSM:   Waverly Ferrariickson, Azai Gaffin [facility] [place] [employer] [facility] [phone]

## 2016-09-17 ENCOUNTER — Encounter: Payer: Self-pay | Admitting: Cardiology

## 2016-09-17 NOTE — Progress Notes (Signed)
Cardiology Office Note  Date: 09/18/2016   ID: Taylor, Long 19-Jul-1940, MRN [id number]  PCP: Gareth Morgan, MD  Evaluating Cardiologist: Nona Dell, MD   Chief Complaint  Patient presents with  . Coronary Artery Disease    History of Present Illness: Taylor Long is a 76 y.o. female last seen by Ms. Taylor Long in May. This my first meeting with her today in clinic. I reviewed her records and updated the chart. She presents for a routine follow-up visit. Since last encounter she does not report any chest pain or unusual shortness of breath. She lives alone in an apartment, takes care of all of her ADLs, enjoys walking for exercise. 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  Patient underwent open AAA repair in March of this year, continues to follow with [facility].  I reviewed her medications. Current cardiac regimen includes aspirin, Norvasc, HCTZ, Cozaar, Lopressor, and potassium supplement.  I went over the results of her cardiac catheterization and echocardiogram from earlier in the year.  Past Medical History:  Diagnosis Date  . CAD (coronary artery disease)    Cardiac catheterization March 2018 showed 80% proximal circumflex and otherwise nonobstructive disease, managed medically  . Cardiomyopathy (HCC)    LVEF 45-50% March 2018  . Essential hypertension   . Hyperlipidemia     Past Surgical History:  Procedure Laterality Date  . ABDOMINAL AORTIC ANEURYSM REPAIR N/A 04/12/2016   Procedure: ANEURYSM ABDOMINAL AORTIC REPAIR;  Surgeon: Chuck Hint, MD;  Location: [facility] OR;  Service: Vascular;  Laterality: N/A;  . LEFT HEART CATH AND CORONARY ANGIOGRAPHY N/A 04/09/2016   Procedure: Left Heart Cath and Coronary Angiography;  Surgeon: Marykay Lex, MD;  Location: [facility] INVASIVE CV LAB;  Service: Cardiovascular;  Laterality: N/A;    Current Outpatient Prescriptions  Medication Sig Dispense Refill  . amLODipine (NORVASC) 5 MG tablet Take 1 tablet (5 mg  total) by mouth 2 (two) times daily. 60 tablet 2  . aspirin EC 81 MG tablet Take 1 tablet (81 mg total) by mouth daily.    . hydrochlorothiazide (HYDRODIURIL) 25 MG tablet Take 0.5 tablets (12.5 mg total) by mouth daily.    Marland Kitchen ibuprofen (ADVIL,MOTRIN) 200 MG tablet Take 200 mg by mouth every 6 (six) hours as needed for moderate pain.    Marland Kitchen LORazepam (ATIVAN) 0.5 MG tablet Take 0.25-0.5 mg by mouth 2 (two) times daily as needed for anxiety.    Marland Kitchen losartan (COZAAR) 50 MG tablet Take 1 tablet by mouth daily.    . metoprolol (LOPRESSOR) 50 MG tablet Take 1 tablet (50 mg total) by mouth 2 (two) times daily. 60 tablet 2  . ondansetron (ZOFRAN) 4 MG tablet Take 1 tablet (4 mg total) by mouth every 8 (eight) hours as needed for nausea or vomiting. 10 tablet 0  . oxyCODONE-acetaminophen (PERCOCET/ROXICET) 5-325 MG tablet Take 1 tablet by mouth every 6 (six) hours as needed for severe pain.    . potassium chloride SA (K-DUR,KLOR-CON) 20 MEQ tablet Take 1 tablet (20 mEq total) by mouth 2 (two) times daily. 14 tablet 0  . atorvastatin (LIPITOR) 20 MG tablet Take 1 tablet (20 mg total) by mouth daily. 90 tablet 3   No current facility-administered medications for this visit.    Allergies:  Crestor [rosuvastatin calcium] and Latex   Social History: The patient  reports that she has quit smoking. She has never used smokeless tobacco. She reports that she does not drink alcohol or  use drugs.   Family History: The patient's family history includes Diabetes in her brother; Hypertension in her father, mother, and sister; Stroke in her father.   ROS:  Please see the history of present illness. Otherwise, complete review of systems is positive for none.  All other systems are reviewed and negative.   Physical Exam: VS:  BP 126/62   Pulse 68   Ht 5\' 7"  (1.702 m)   Wt 162 lb (73.5 kg)   SpO2 95%   BMI 25.37 kg/m , BMI Body mass index is 25.37 kg/m.  Wt Readings from Last 3 Encounters:  09/18/16 162 lb (73.5  kg)  08/21/16 156 lb 14.4 oz (71.2 kg)  06/04/16 148 lb (67.1 kg)    General: Elderly woman, appears comfortable at rest. HEENT: Conjunctiva and lids normal, oropharynx clear. Neck: Supple, no elevated JVP or carotid bruits, no thyromegaly. Lungs: Clear to auscultation, nonlabored breathing at rest. Cardiac: Regular rate and rhythm, no S3 or significant systolic murmur, no pericardial rub. Abdomen: Soft, nontender, bowel sounds present, no guarding or rebound. Extremities: No pitting edema, distal pulses 2+. Skin: Warm and dry. Musculoskeletal: No kyphosis. Neuropsychiatric: Alert and oriented x3, affect grossly appropriate.  ECG: I personally reviewed the tracing from 06/04/2016 which showed sinus rhythm with nonspecific T-wave changes.  Recent Labwork: 03/18/2016: B Natriuretic Peptide 307.0 04/13/2016: Magnesium 1.8 06/03/2016: ALT 17; AST 21 06/04/2016: BUN 24; Creatinine, Ser 0.99; Hemoglobin 11.9; Platelets 291; Potassium 2.5; Sodium 136     Component Value Date/Time   CHOL 121 04/18/2016 1023   TRIG 120 04/18/2016 1023   HDL 40 (L) 04/18/2016 1023   CHOLHDL 3.0 04/18/2016 1023   VLDL 24 04/18/2016 1023   LDLCALC 57 04/18/2016 1023    Other Studies Reviewed Today:  Cardiac catheterization 04/09/2016:  Prox Cx lesion, 80 %stenosed. - Focal napkin ring calcified lesion. Would be difficult PCI target  Ost LM to Prox LAD - dense moderate calcification  Prox RCA lesion, 25 %stenosed - diffusely calcified  The left ventricular ejection fraction is 55-65% by visual estimate.  LV end diastolic pressure is normal.   Severe single vessel disease involving the proximal circumflex napkin ring lesion. This would be a very difficult target for PCI due to poor visualization, and calcification. Would be very difficult to advance equipment based on the angulation of the circumflex complex takeoff.  Echocardiogram 04/08/2016: Study Conclusions  - Left ventricle: The cavity size was  normal. Wall thickness was   increased in a pattern of mild LVH. Systolic function was mildly   reduced. The estimated ejection fraction was in the range of 45%   to 50%. Doppler parameters are consistent with abnormal left   ventricular relaxation (grade 1 diastolic dysfunction). The E/e&'   ratio is between 8-15, suggesting indeterminate LV filling   pressure. - Left atrium: The atrium was normal in size. - Atrial septum: There was increased thickness of the septum,   consistent with lipomatous hypertrophy. - Inferior vena cava: The vessel was normal in size. The   respirophasic diameter changes were in the normal range (>= 50%),   consistent with normal central venous pressure.  Impressions:  - LVEF 45-50%, mild LVH, basal to mid inferior hypokinesis,   diastolic dysfunction, indeterminate LV filling pressure, normal   LA size, normal IVC.  Assessment and Plan:  1. Single-vessel CAD involving the proximal circumflex as outlined above, asymptomatic in terms of angina on medical therapy. Would continue with current regimen. Discussed continuing walking regimen  for exercise. Follow-up arranged.  2. Status post open AAA repair in March of this year, continues to follow with [facility].  3. Cardiomyopathy with mildly reduced LVEF in the range of 45-50%. No evidence of fluid excess.  4. Hyperlipidemia, tolerating Lipitor. She did not tolerate Crestor. Would follow-up lipid panel with Dr. Sudie Bailey later in the year.  Current medicines were reviewed with the patient today.  Disposition: Follow-up in 6 months.  Signed, Jonelle Sidle, MD, [facility] 09/18/2016 1:53 PM    [facility] at [facility] [address], [place], [place] [address] Phone: [phone]; Fax: [phone]

## 2016-09-18 ENCOUNTER — Ambulatory Visit (INDEPENDENT_AMBULATORY_CARE_PROVIDER_SITE_OTHER): Payer: Medicare Other | Admitting: Cardiology

## 2016-09-18 ENCOUNTER — Encounter: Payer: Self-pay | Admitting: Cardiology

## 2016-09-18 VITALS — BP 126/62 | HR 68 | Ht 67.0 in | Wt 162.0 lb

## 2016-09-18 DIAGNOSIS — E782 Mixed hyperlipidemia: Secondary | ICD-10-CM | POA: Diagnosis not present

## 2016-09-18 DIAGNOSIS — I2510 Atherosclerotic heart disease of native coronary artery without angina pectoris: Secondary | ICD-10-CM | POA: Diagnosis not present

## 2016-09-18 DIAGNOSIS — Z8679 Personal history of other diseases of the circulatory system: Secondary | ICD-10-CM

## 2016-09-18 DIAGNOSIS — I429 Cardiomyopathy, unspecified: Secondary | ICD-10-CM

## 2016-09-18 DIAGNOSIS — Z9889 Other specified postprocedural states: Secondary | ICD-10-CM | POA: Diagnosis not present

## 2016-09-18 NOTE — Patient Instructions (Signed)
Your physician wants you to follow-up in:6 months with Dr.McDowell You will receive a reminder letter in the mail two months in advance. If you don't receive a letter, please call our office to schedule the follow-up appointment.   Your physician recommends that you continue on your current medications as directed. Please refer to the Current Medication list given to you today.   If you need a refill on your cardiac medications before your next appointment, please call your pharmacy.    No lab work or tests ordered today.      Thank you for choosing [facility] !         

## 2017-03-21 NOTE — Progress Notes (Signed)
Cardiology Office Note  Date: 03/24/2017   ID: Taylor Long, DOB Feb 04, 1940, MRN [id number]  PCP: Gareth Morgan, MD  Primary Cardiologist: Nona Dell, MD   Chief Complaint  Patient presents with  . Coronary Artery Disease    History of Present Illness: Taylor Long is a 77 y.o. female last seen in August 2018.  She presents today for a routine follow-up visit.  Since last assessment she does not report any recurring chest pain.  She has had occasional palpitations, but no dizziness or syncope.  I went over her medications today which are outlined below.  She is frustrated with having to take her medicines, but states that she has been compliant.  She has pending lab work next month for a physical with Dr. Sudie Bailey.  Cardiac testing from March of last year is outlined below.  We are continuing medical therapy.  Past Medical History:  Diagnosis Date  . CAD (coronary artery disease)    Cardiac catheterization March 2018 showed 80% proximal circumflex and otherwise nonobstructive disease, managed medically  . Cardiomyopathy (HCC)    LVEF 45-50% March 2018  . Essential hypertension   . Hyperlipidemia     Past Surgical History:  Procedure Laterality Date  . ABDOMINAL AORTIC ANEURYSM REPAIR N/A 04/12/2016   Procedure: ANEURYSM ABDOMINAL AORTIC REPAIR;  Surgeon: Chuck Hint, MD;  Location: [facility] OR;  Service: Vascular;  Laterality: N/A;  . LEFT HEART CATH AND CORONARY ANGIOGRAPHY N/A 04/09/2016   Procedure: Left Heart Cath and Coronary Angiography;  Surgeon: Marykay Lex, MD;  Location: [facility] INVASIVE CV LAB;  Service: Cardiovascular;  Laterality: N/A;    Current Outpatient Medications  Medication Sig Dispense Refill  . amLODipine (NORVASC) 5 MG tablet Take 1 tablet (5 mg total) by mouth 2 (two) times daily. 60 tablet 2  . aspirin EC 81 MG tablet Take 1 tablet (81 mg total) by mouth daily.    Marland Kitchen atorvastatin (LIPITOR) 20 MG tablet Take 1 tablet (20 mg total) by  mouth daily. 90 tablet 3  . hydrochlorothiazide (HYDRODIURIL) 25 MG tablet Take 0.5 tablets (12.5 mg total) by mouth daily.    Marland Kitchen ibuprofen (ADVIL,MOTRIN) 200 MG tablet Take 200 mg by mouth every 6 (six) hours as needed for moderate pain.    Marland Kitchen LORazepam (ATIVAN) 0.5 MG tablet Take 0.25-0.5 mg by mouth 2 (two) times daily as needed for anxiety.    Marland Kitchen losartan (COZAAR) 50 MG tablet Take 1 tablet by mouth daily.    . metoprolol (LOPRESSOR) 50 MG tablet Take 1 tablet (50 mg total) by mouth 2 (two) times daily. 60 tablet 2  . ondansetron (ZOFRAN) 4 MG tablet Take 1 tablet (4 mg total) by mouth every 8 (eight) hours as needed for nausea or vomiting. 10 tablet 0  . oxyCODONE-acetaminophen (PERCOCET/ROXICET) 5-325 MG tablet Take 1 tablet by mouth every 6 (six) hours as needed for severe pain.    . potassium chloride SA (K-DUR,KLOR-CON) 20 MEQ tablet Take 1 tablet (20 mEq total) by mouth 2 (two) times daily. 14 tablet 0   No current facility-administered medications for this visit.    Allergies:  Crestor [rosuvastatin calcium] and Latex   Social History: The patient  reports that she has quit smoking. she has never used smokeless tobacco. She reports that she does not drink alcohol or use drugs.   ROS:  Please see the history of present illness. Otherwise, complete review of systems is positive for arthritic stiffness.  All other  systems are reviewed and negative.   Physical Exam: VS:  BP (!) 146/80   Pulse 66   Ht 5\' 6"  (1.676 m)   Wt 179 lb (81.2 kg)   SpO2 98%   BMI 28.89 kg/m , BMI Body mass index is 28.89 kg/m.  Wt Readings from Last 3 Encounters:  03/24/17 179 lb (81.2 kg)  09/18/16 162 lb (73.5 kg)  08/21/16 156 lb 14.4 oz (71.2 kg)    General: Elderly woman, no distress. HEENT: Conjunctiva and lids normal, oropharynx clear. Neck: Supple, no elevated JVP or carotid bruits, no thyromegaly. Lungs: Clear to auscultation, nonlabored breathing at rest. Cardiac: Regular rate and rhythm, no  S3 or significant systolic murmur, no pericardial rub. Abdomen: Soft, nontender, bowel sounds present. Extremities: No pitting edema, distal pulses 2+. Skin: Warm and dry. Musculoskeletal: No kyphosis. Neuropsychiatric: Alert and oriented x3, affect grossly appropriate.  ECG: I personally reviewed the tracing from 06/04/2016 which showed sinus rhythm with nonspecific T wave changes.  Recent Labwork: 04/13/2016: Magnesium 1.8 06/03/2016: ALT 17; AST 21 06/04/2016: BUN 24; Creatinine, Ser 0.99; Hemoglobin 11.9; Platelets 291; Potassium 2.5; Sodium 136     Component Value Date/Time   CHOL 121 04/18/2016 1023   TRIG 120 04/18/2016 1023   HDL 40 (L) 04/18/2016 1023   CHOLHDL 3.0 04/18/2016 1023   VLDL 24 04/18/2016 1023   LDLCALC 57 04/18/2016 1023    Other Studies Reviewed Today:  Cardiac catheterization 04/09/2016:  Prox Cx lesion, 80 %stenosed. - Focal napkin ring calcified lesion. Would be difficult PCI target  Ost LM to Prox LAD - dense moderate calcification  Prox RCA lesion, 25 %stenosed - diffusely calcified  The left ventricular ejection fraction is 55-65% by visual estimate.  LV end diastolic pressure is normal.  Severe single vessel disease involving the proximal circumflex napkin ring lesion. This would be a very difficult target for PCI due to poor visualization, and calcification. Would be very difficult to advance equipment based on the angulation of the circumflex complex takeoff.  Echocardiogram 04/08/2016: Study Conclusions  - Left ventricle: The cavity size was normal. Wall thickness was increased in a pattern of mild LVH. Systolic function was mildly reduced. The estimated ejection fraction was in the range of 45% to 50%. Doppler parameters are consistent with abnormal left ventricular relaxation (grade 1 diastolic dysfunction). The E/e&' ratio is between 8-15, suggesting indeterminate LV filling pressure. - Left atrium: The atrium was normal in  size. - Atrial septum: There was increased thickness of the septum, consistent with lipomatous hypertrophy. - Inferior vena cava: The vessel was normal in size. The respirophasic diameter changes were in the normal range (>= 50%), consistent with normal central venous pressure.  Impressions:  - LVEF 45-50%, mild LVH, basal to mid inferior hypokinesis, diastolic dysfunction, indeterminate LV filling pressure, normal LA size, normal IVC.  Assessment and Plan:  1.  Single vessel CAD involving the proximal circumflex with difficult PCI options.  She reports no progressive angina on medical therapy and we will continue with observation.  2.  Status post open AAA repair in March 2018.  She is asymptomatic at this time.  3.  Cardiomyopathy with LVEF 45-50%.  Continue medical therapy.  4.  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  She has follow-up lab work next month with Dr. Sudie BaileyKnowlton.  Current medicines were reviewed with the patient today.  Disposition: Follow-up in 6 months.  Signed, Jonelle SidleSamuel G. McDowell, MD, [facility] 03/24/2017 9:30 AM    [facility]  at [place]. [address], [place], [place] [address] Phone: [phone]; Fax: [phone]

## 2017-03-24 ENCOUNTER — Ambulatory Visit (INDEPENDENT_AMBULATORY_CARE_PROVIDER_SITE_OTHER): Payer: Medicare Other | Admitting: Cardiology

## 2017-03-24 ENCOUNTER — Encounter: Payer: Self-pay | Admitting: Cardiology

## 2017-03-24 VITALS — BP 146/80 | HR 66 | Ht 66.0 in | Wt 179.0 lb

## 2017-03-24 DIAGNOSIS — I429 Cardiomyopathy, unspecified: Secondary | ICD-10-CM | POA: Diagnosis not present

## 2017-03-24 DIAGNOSIS — Z8679 Personal history of other diseases of the circulatory system: Secondary | ICD-10-CM | POA: Diagnosis not present

## 2017-03-24 DIAGNOSIS — Z9889 Other specified postprocedural states: Secondary | ICD-10-CM | POA: Diagnosis not present

## 2017-03-24 DIAGNOSIS — I25119 Atherosclerotic heart disease of native coronary artery with unspecified angina pectoris: Secondary | ICD-10-CM | POA: Diagnosis not present

## 2017-03-24 DIAGNOSIS — E782 Mixed hyperlipidemia: Secondary | ICD-10-CM | POA: Diagnosis not present

## 2017-03-24 NOTE — Patient Instructions (Addendum)
Your physician wants you to follow-up in:6 months with Dr.McDowell You will receive a reminder letter in the mail two months in advance. If you don't receive a letter, please call our office to schedule the follow-up appointment.   Your physician recommends that you continue on your current medications as directed. Please refer to the Current Medication list given to you today.   If you need a refill on your cardiac medications before your next appointment, please call your pharmacy.    No lab work or tests ordered today.      Thank you for choosing [facility] !         

## 2017-05-21 ENCOUNTER — Other Ambulatory Visit: Payer: Self-pay | Admitting: Adult Health

## 2017-05-22 ENCOUNTER — Other Ambulatory Visit: Payer: Self-pay | Admitting: Cardiology

## 2017-05-22 MED ORDER — ATORVASTATIN CALCIUM 20 MG PO TABS: 20.0000 mg | ORAL_TABLET | Freq: Every day | ORAL | 3 refills | Status: DC

## 2017-05-22 NOTE — Telephone Encounter (Signed)
Needing refill for atorvastatin (LIPITOR) 20 MG tablet [[id number]][[id number]] ENDED  [facility]

## 2017-05-22 NOTE — Telephone Encounter (Signed)
Refill lipitor done

## 2017-07-22 ENCOUNTER — Other Ambulatory Visit (HOSPITAL_COMMUNITY): Payer: Self-pay | Admitting: Family Medicine

## 2017-07-22 DIAGNOSIS — R5383 Other fatigue: Secondary | ICD-10-CM | POA: Diagnosis not present

## 2017-07-22 DIAGNOSIS — R109 Unspecified abdominal pain: Secondary | ICD-10-CM

## 2017-07-22 DIAGNOSIS — R002 Palpitations: Secondary | ICD-10-CM | POA: Diagnosis not present

## 2017-07-22 DIAGNOSIS — Z87891 Personal history of nicotine dependence: Secondary | ICD-10-CM | POA: Diagnosis not present

## 2017-07-22 DIAGNOSIS — I10 Essential (primary) hypertension: Secondary | ICD-10-CM | POA: Diagnosis not present

## 2017-07-24 ENCOUNTER — Ambulatory Visit (HOSPITAL_COMMUNITY): Payer: Medicare Other

## 2017-08-04 ENCOUNTER — Ambulatory Visit (HOSPITAL_COMMUNITY): Payer: Medicare Other

## 2017-08-26 DIAGNOSIS — E782 Mixed hyperlipidemia: Secondary | ICD-10-CM | POA: Diagnosis not present

## 2017-08-26 DIAGNOSIS — N189 Chronic kidney disease, unspecified: Secondary | ICD-10-CM | POA: Diagnosis not present

## 2017-08-26 DIAGNOSIS — E1165 Type 2 diabetes mellitus with hyperglycemia: Secondary | ICD-10-CM | POA: Diagnosis not present

## 2017-08-26 DIAGNOSIS — I10 Essential (primary) hypertension: Secondary | ICD-10-CM | POA: Diagnosis not present

## 2017-08-27 ENCOUNTER — Ambulatory Visit (INDEPENDENT_AMBULATORY_CARE_PROVIDER_SITE_OTHER): Payer: Medicare Other | Admitting: Vascular Surgery

## 2017-08-27 ENCOUNTER — Other Ambulatory Visit: Payer: Self-pay

## 2017-08-27 ENCOUNTER — Encounter: Payer: Self-pay | Admitting: Vascular Surgery

## 2017-08-27 VITALS — BP 154/83 | HR 61 | Temp 97.4°F | Resp 16 | Ht 67.0 in | Wt 175.0 lb

## 2017-08-27 DIAGNOSIS — I714 Abdominal aortic aneurysm, without rupture, unspecified: Secondary | ICD-10-CM

## 2017-08-27 NOTE — Progress Notes (Signed)
Patient name: Taylor Long MRN: [id number] DOB: 1940-03-21 Sex: female  REASON FOR VISIT:   1 year follow-up visit.  HPI:   DONISHA HOCH is a pleasant 77 y.o. 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  This was in March 2018.  When I saw her last she was doing well and she now comes in for a one-year follow-up visit.  Today the patient has no specific complaints.  She has no claudication, rest pain, or nonhealing ulcers.  She denies any abdominal or back pain.  Past Medical History:  Diagnosis Date  . CAD (coronary artery disease)    Cardiac catheterization March 2018 showed 80% proximal circumflex and otherwise nonobstructive disease, managed medically  . Cardiomyopathy (HCC)    LVEF 45-50% March 2018  . Essential hypertension   . Hyperlipidemia     Family History  Problem Relation Age of Onset  . Hypertension Mother   . Stroke Father   . Hypertension Father   . Hypertension Sister   . Diabetes Brother     SOCIAL HISTORY: Social History   Tobacco Use  . Smoking status: Former Games developer  . Smokeless tobacco: Never Used  Substance Use Topics  . Alcohol use: No    Allergies  Allergen Reactions  . Crestor [Rosuvastatin Calcium] Nausea Only  . Latex Rash    Current Outpatient Medications  Medication Sig Dispense Refill  . amLODipine (NORVASC) 5 MG tablet Take 1 tablet (5 mg total) by mouth 2 (two) times daily. 60 tablet 2  . aspirin EC 81 MG tablet Take 1 tablet (81 mg total) by mouth daily.    Marland Kitchen atorvastatin (LIPITOR) 20 MG tablet TAKE 1 TABLET BY MOUTH ONCE DAILY 90 tablet 3  . hydrochlorothiazide (HYDRODIURIL) 25 MG tablet Take 0.5 tablets (12.5 mg total) by mouth daily.    Marland Kitchen ibuprofen (ADVIL,MOTRIN) 200 MG tablet Take 200 mg by mouth every 6 (six) hours as needed for moderate pain.    Marland Kitchen LORazepam (ATIVAN) 0.5 MG tablet Take 0.25-0.5 mg by mouth 2 (two) times daily as needed for anxiety.    Marland Kitchen losartan (COZAAR) 50 MG tablet  Take 1 tablet by mouth daily.    . metoprolol (LOPRESSOR) 50 MG tablet Take 1 tablet (50 mg total) by mouth 2 (two) times daily. 60 tablet 2  . potassium chloride SA (K-DUR,KLOR-CON) 20 MEQ tablet Take 1 tablet (20 mEq total) by mouth 2 (two) times daily. 14 tablet 0  . atorvastatin (LIPITOR) 20 MG tablet Take 1 tablet (20 mg total) by mouth daily. 90 tablet 3  . ondansetron (ZOFRAN) 4 MG tablet Take 1 tablet (4 mg total) by mouth every 8 (eight) hours as needed for nausea or vomiting. (Patient not taking: Reported on 08/27/2017) 10 tablet 0  . oxyCODONE-acetaminophen (PERCOCET/ROXICET) 5-325 MG tablet Take 1 tablet by mouth every 6 (six) hours as needed for severe pain.     No current facility-administered medications for this visit.     REVIEW OF SYSTEMS:  [X]  denotes positive finding, [ ]  denotes negative finding Cardiac  Comments:  Chest pain or chest pressure:    Shortness of breath upon exertion: x    Short of breath when lying flat: x   Irregular heart rhythm: x       Vascular    Pain in calf, thigh, or hip brought on by ambulation:    Pain in feet at night that wakes you up from your sleep:  Blood clot in your veins:    Leg swelling:         Pulmonary    Oxygen at home:    Productive cough:     Wheezing:         Neurologic    Sudden weakness in arms or legs:     Sudden numbness in arms or legs:     Sudden onset of difficulty speaking or slurred speech:    Temporary loss of vision in one eye:     Problems with dizziness:         Gastrointestinal    Blood in stool:     Vomited blood:         Genitourinary    Burning when urinating:     Blood in urine:        Psychiatric    Major depression:         Hematologic    Bleeding problems:    Problems with blood clotting too easily:        Skin    Rashes or ulcers:        Constitutional    Fever or chills:     PHYSICAL EXAM:   Vitals:   08/27/17 1043 08/27/17 1046  BP: (!) 158/83 (!) 154/83  Pulse: 61 61    Resp: 16   Temp: (!) 97.4 F (36.3 C)   TempSrc: Oral   SpO2: 98%   Weight: 175 lb (79.4 kg)   Height: 5\' 7"  (1.702 m)     GENERAL: The patient is a well-nourished female, in no acute distress. The vital signs are documented above. CARDIAC: There is a regular rate and rhythm.  VASCULAR: I do not detect carotid bruits. She has palpable femoral and dorsalis pedis pulses bilaterally. PULMONARY: There is good air exchange bilaterally without wheezing or rales. ABDOMEN: Soft and non-tender with normal pitched bowel sounds.  Her abdominal incision is well-healed. MUSCULOSKELETAL: There are no major deformities or cyanosis. NEUROLOGIC: No focal weakness or paresthesias are detected. SKIN: There are no ulcers or rashes noted. PSYCHIATRIC: The patient has a normal affect.  DATA:    No new data.  MEDICAL ISSUES:   STATUS POST OPEN REPAIR OF JUXTARENAL ABDOMINAL AORTIC ANEURYSM: Patient is doing well status post open repair of her 8 cm aneurysm.  She has no specific complaints.  Based on Society of vascular surgery recommendations I have ordered a CT of the abdomen and pelvis in 4 years which would be a 5-year follow-up study.  I will see her back at that time.  She is not a smoker.  She is on aspirin and is on a statin.  She knows to call sooner if she has problems.  We have also discussed the importance of receiving prophylactic antibiotics for any invasive procedures given her prosthetic graft.  Waverly Ferrarihristopher Kervens [facility] Bee[facility] [phone]

## 2017-08-27 NOTE — Progress Notes (Signed)
Vitals:   08/27/17 1043  BP: (!) 158/83  Pulse: 61  Resp: 16  Temp: (!) 97.4 F (36.3 C)  TempSrc: Oral  SpO2: 98%  Weight: 175 lb (79.4 kg)  Height: 5\' 7"  (1.702 m)

## 2017-09-04 DIAGNOSIS — I10 Essential (primary) hypertension: Secondary | ICD-10-CM | POA: Diagnosis not present

## 2017-09-04 DIAGNOSIS — E1165 Type 2 diabetes mellitus with hyperglycemia: Secondary | ICD-10-CM | POA: Diagnosis not present

## 2017-09-04 DIAGNOSIS — N189 Chronic kidney disease, unspecified: Secondary | ICD-10-CM | POA: Diagnosis not present

## 2017-09-04 DIAGNOSIS — E782 Mixed hyperlipidemia: Secondary | ICD-10-CM | POA: Diagnosis not present

## 2017-09-11 DIAGNOSIS — E119 Type 2 diabetes mellitus without complications: Secondary | ICD-10-CM | POA: Diagnosis not present

## 2017-09-11 DIAGNOSIS — E782 Mixed hyperlipidemia: Secondary | ICD-10-CM | POA: Diagnosis not present

## 2017-09-11 DIAGNOSIS — Z0000 Encounter for general adult medical examination without abnormal findings: Secondary | ICD-10-CM | POA: Diagnosis not present

## 2017-09-11 DIAGNOSIS — I10 Essential (primary) hypertension: Secondary | ICD-10-CM | POA: Diagnosis not present

## 2017-09-15 DIAGNOSIS — R5383 Other fatigue: Secondary | ICD-10-CM | POA: Diagnosis not present

## 2017-09-15 DIAGNOSIS — Z0000 Encounter for general adult medical examination without abnormal findings: Secondary | ICD-10-CM | POA: Diagnosis not present

## 2017-09-15 DIAGNOSIS — E1165 Type 2 diabetes mellitus with hyperglycemia: Secondary | ICD-10-CM | POA: Diagnosis not present

## 2017-09-15 DIAGNOSIS — N189 Chronic kidney disease, unspecified: Secondary | ICD-10-CM | POA: Diagnosis not present

## 2017-09-15 DIAGNOSIS — E119 Type 2 diabetes mellitus without complications: Secondary | ICD-10-CM | POA: Diagnosis not present

## 2017-09-25 DIAGNOSIS — I10 Essential (primary) hypertension: Secondary | ICD-10-CM | POA: Diagnosis not present

## 2017-09-25 DIAGNOSIS — R197 Diarrhea, unspecified: Secondary | ICD-10-CM | POA: Diagnosis not present

## 2017-10-01 NOTE — Progress Notes (Signed)
Cardiology Office Note  Date: 10/02/2017   ID: HEIDI WHITSON, DOB 06-08-1940, MRN [id number]  PCP: Benita Stabile, MD  Primary Cardiologist: Nona Dell, MD   Chief Complaint  Patient presents with  . Coronary Artery Disease    History of Present Illness: Taylor Long is a 77 y.o. female last seen in February.  She is here for a routine visit.  She does not report any angina symptoms or worsening shortness of breath with typical activities.  She enjoys walking in the mornings a few days a week.  She continues to follow with [facility], saw Dr. Edilia Bo in July status post open AAA repair.  I reviewed the recent note.  I went over her medications which are stable from a cardiac perspective and outlined below.  She reports no obvious intolerances.  She is following with Dr. Margo Aye for primary care, I went over her interval lipid panel with LDL 40.  Past Medical History:  Diagnosis Date  . CAD (coronary artery disease)    Cardiac catheterization March 2018 showed 80% proximal circumflex and otherwise nonobstructive disease, managed medically  . Cardiomyopathy (HCC)    LVEF 45-50% March 2018  . Essential hypertension   . Hyperlipidemia     Past Surgical History:  Procedure Laterality Date  . ABDOMINAL AORTIC ANEURYSM REPAIR N/A 04/12/2016   Procedure: ANEURYSM ABDOMINAL AORTIC REPAIR;  Surgeon: Chuck Hint, MD;  Location: [facility] OR;  Service: Vascular;  Laterality: N/A;  . LEFT HEART CATH AND CORONARY ANGIOGRAPHY N/A 04/09/2016   Procedure: Left Heart Cath and Coronary Angiography;  Surgeon: Marykay Lex, MD;  Location: [facility] INVASIVE CV LAB;  Service: Cardiovascular;  Laterality: N/A;    Current Outpatient Medications  Medication Sig Dispense Refill  . amLODipine (NORVASC) 5 MG tablet Take 1 tablet (5 mg total) by mouth 2 (two) times daily. 60 tablet 2  . aspirin EC 81 MG tablet Take 1 tablet (81 mg total) by mouth daily.    Marland Kitchen atorvastatin (LIPITOR) 20 MG tablet TAKE 1 TABLET  BY MOUTH ONCE DAILY 90 tablet 3  . atorvastatin (LIPITOR) 20 MG tablet Take 1 tablet (20 mg total) by mouth daily. 90 tablet 3  . cholestyramine (QUESTRAN) 4 GM/DOSE powder Take 4 g by mouth 2 (two) times daily with a meal.    . ibuprofen (ADVIL,MOTRIN) 200 MG tablet Take 200 mg by mouth every 6 (six) hours as needed for moderate pain.    Marland Kitchen LORazepam (ATIVAN) 0.5 MG tablet Take 0.25-0.5 mg by mouth 2 (two) times daily as needed for anxiety.    Marland Kitchen losartan (COZAAR) 50 MG tablet Take 1 tablet by mouth daily.    . metoprolol (LOPRESSOR) 50 MG tablet Take 1 tablet (50 mg total) by mouth 2 (two) times daily. 60 tablet 2  . ondansetron (ZOFRAN) 4 MG tablet Take 1 tablet (4 mg total) by mouth every 8 (eight) hours as needed for nausea or vomiting. 10 tablet 0  . potassium chloride SA (K-DUR,KLOR-CON) 20 MEQ tablet Take 1 tablet (20 mEq total) by mouth 2 (two) times daily. 14 tablet 0  . Vitamin D, Ergocalciferol, (DRISDOL) 50000 units CAPS capsule Take 50,000 Units by mouth every 7 (seven) days.     No current facility-administered medications for this visit.    Allergies:  Crestor [rosuvastatin calcium] and Latex   Social History: The patient  reports that she has quit smoking. She has never used smokeless tobacco. She reports that she does not drink  alcohol or use drugs.   ROS:  Please see the history of present illness. Otherwise, complete review of systems is positive for none.  All other systems are reviewed and negative.   Physical Exam: VS:  BP 140/80 (BP Location: Left Arm)   Pulse 79   Ht 5\' 7"  (1.702 m)   Wt 179 lb (81.2 kg)   SpO2 95%   BMI 28.04 kg/m , BMI Body mass index is 28.04 kg/m.  Wt Readings from Last 3 Encounters:  10/02/17 179 lb (81.2 kg)  08/27/17 175 lb (79.4 kg)  03/24/17 179 lb (81.2 kg)    General: Elderly woman, appears comfortable at rest. HEENT: Conjunctiva and lids normal, oropharynx clear. Neck: Supple, no elevated JVP or carotid bruits, no  thyromegaly. Lungs: Clear to auscultation, nonlabored breathing at rest. Cardiac: Regular rate and rhythm, no S3 or significant systolic murmur. Abdomen: Soft, nontender, bowel sounds present. Extremities: No pitting edema, distal pulses 2+. Skin: Warm and dry. Musculoskeletal: No kyphosis. Neuropsychiatric: Alert and oriented x3, affect grossly appropriate.  ECG: I personally reviewed the tracing from 06/04/2017 which showed sinus rhythm with nonspecific T wave changes.  Recent Labwork:  March 2019: Cholesterol 102, HDL 44, triglycerides 95, LDL 40, BUN 19, creatinine 1.12, potassium 3.7, AST 18, ALT 16, hemoglobin 13.3, platelets 292, hemoglobin A1c 6.6  Other Studies Reviewed Today:  Cardiac catheterization 04/09/2016:  Prox Cx lesion, 80 %stenosed. - Focal napkin ring calcified lesion. Would be difficult PCI target  Ost LM to Prox LAD - dense moderate calcification  Prox RCA lesion, 25 %stenosed - diffusely calcified  The left ventricular ejection fraction is 55-65% by visual estimate.  LV end diastolic pressure is normal.  Severe single vessel disease involving the proximal circumflex napkin ring lesion. This would be a very difficult target for PCI due to poor visualization, and calcification. Would be very difficult to advance equipment based on the angulation of the circumflex complex takeoff.  Echocardiogram3/12/2016: Study Conclusions  - Left ventricle: The cavity size was normal. Wall thickness was increased in a pattern of mild LVH. Systolic function was mildly reduced. The estimated ejection fraction was in the range of 45% to 50%. Doppler parameters are consistent with abnormal left ventricular relaxation (grade 1 diastolic dysfunction). The E/e&' ratio is between 8-15, suggesting indeterminate LV filling pressure. - Left atrium: The atrium was normal in size. - Atrial septum: There was increased thickness of the septum, consistent with  lipomatous hypertrophy. - Inferior vena cava: The vessel was normal in size. The respirophasic diameter changes were in the normal range (>= 50%), consistent with normal central venous pressure.  Impressions:  - LVEF 45-50%, mild LVH, basal to mid inferior hypokinesis, diastolic dysfunction, indeterminate LV filling pressure, normal LA size, normal IVC.  Assessment and Plan:  1.  Single-vessel CAD involving the proximal circumflex with plan for medical therapy.  She reports no obvious angina symptoms and continues on aspirin along with statin.  2.  Abdominal aortic aneurysm status post open repair in March 2018.  She continues to follow with Dr. Edilia Bo.  3.  Cardiomyopathy with LVEF 45 to 50% range.  No evidence of fluid overload.  Continue beta-blocker and ARB.  4.  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  Last LDL 40.  Current medicines were reviewed with the patient today.   Orders Placed This Encounter  Procedures  . EKG 12-Lead    Disposition: Follow-up in 6 months.  Signed, Jonelle Sidle, MD, [facility] 10/02/2017 11:01 AM  [facility] [address], [place], [place] [address] Phone: [phone]; Fax: [phone]

## 2017-10-02 ENCOUNTER — Encounter: Payer: Self-pay | Admitting: Cardiology

## 2017-10-02 ENCOUNTER — Ambulatory Visit (INDEPENDENT_AMBULATORY_CARE_PROVIDER_SITE_OTHER): Payer: Medicare Other | Admitting: Cardiology

## 2017-10-02 VITALS — BP 140/80 | HR 79 | Ht 67.0 in | Wt 179.0 lb

## 2017-10-02 DIAGNOSIS — I25119 Atherosclerotic heart disease of native coronary artery with unspecified angina pectoris: Secondary | ICD-10-CM

## 2017-10-02 DIAGNOSIS — E782 Mixed hyperlipidemia: Secondary | ICD-10-CM | POA: Diagnosis not present

## 2017-10-02 DIAGNOSIS — Z8679 Personal history of other diseases of the circulatory system: Secondary | ICD-10-CM

## 2017-10-02 DIAGNOSIS — Z9889 Other specified postprocedural states: Secondary | ICD-10-CM | POA: Diagnosis not present

## 2017-10-02 DIAGNOSIS — I429 Cardiomyopathy, unspecified: Secondary | ICD-10-CM | POA: Diagnosis not present

## 2017-10-02 NOTE — Patient Instructions (Signed)
Your physician wants you to follow-up in:  6 months with Dr.McDowell You will receive a reminder letter in the mail two months in advance. If you don't receive a letter, please call our office to schedule the follow-up appointment.    Your physician recommends that you continue on your current medications as directed. Please refer to the Current Medication list given to you today.    If you need a refill on your cardiac medications before your next appointment, please call your pharmacy.     No labs or tests ordered today      Thank you for choosing  [facility] !        

## 2017-10-13 DIAGNOSIS — N183 Chronic kidney disease, stage 3 (moderate): Secondary | ICD-10-CM | POA: Diagnosis not present

## 2017-10-13 DIAGNOSIS — R197 Diarrhea, unspecified: Secondary | ICD-10-CM | POA: Diagnosis not present

## 2017-10-13 DIAGNOSIS — R5383 Other fatigue: Secondary | ICD-10-CM | POA: Diagnosis not present

## 2017-10-13 DIAGNOSIS — R944 Abnormal results of kidney function studies: Secondary | ICD-10-CM | POA: Diagnosis not present

## 2017-10-13 DIAGNOSIS — Z23 Encounter for immunization: Secondary | ICD-10-CM | POA: Diagnosis not present

## 2017-10-13 DIAGNOSIS — I10 Essential (primary) hypertension: Secondary | ICD-10-CM | POA: Diagnosis not present

## 2017-10-13 DIAGNOSIS — I130 Hypertensive heart and chronic kidney disease with heart failure and stage 1 through stage 4 chronic kidney disease, or unspecified chronic kidney disease: Secondary | ICD-10-CM | POA: Diagnosis not present

## 2017-10-13 DIAGNOSIS — E782 Mixed hyperlipidemia: Secondary | ICD-10-CM | POA: Diagnosis not present

## 2017-10-21 IMAGING — CT CT ANGIO CHEST-ABD-PELV FOR DISSECTION W/ AND WO/W CM
2 of 7 series · 11 of 36 positions shown, 14 images · IV contrast (Isovue)
Comparison: None.

CLINICAL DATA: 75-year-old female with abdominal pain and abdominal
aortic aneurysm.

EXAM:
CT CHEST, ABDOMEN, AND PELVIS WITH CONTRAST
TECHNIQUE: Multidetector CT imaging of the chest, abdomen and pelvis was
performed following the standard protocol during bolus
administration of intravenous contrast.
CONTRAST:  100 cc IV contrast

[Series 5: dissection axial arterial · axial · arterial · 0.69mm/px · z∈[-634,-112]mm · 10 of 200 slices shown, 13 images]
[im 13/200  mediastinal]
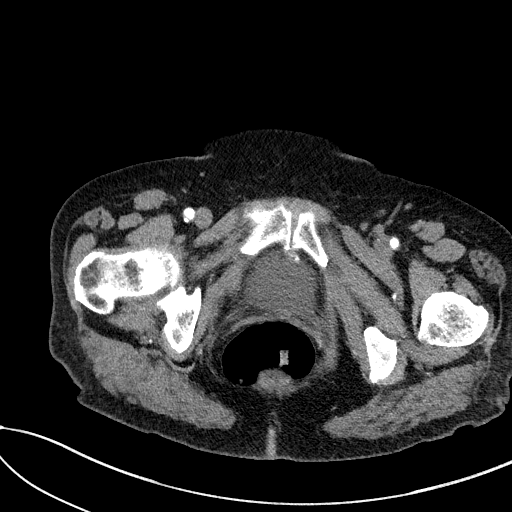
[im 13/200  bone]
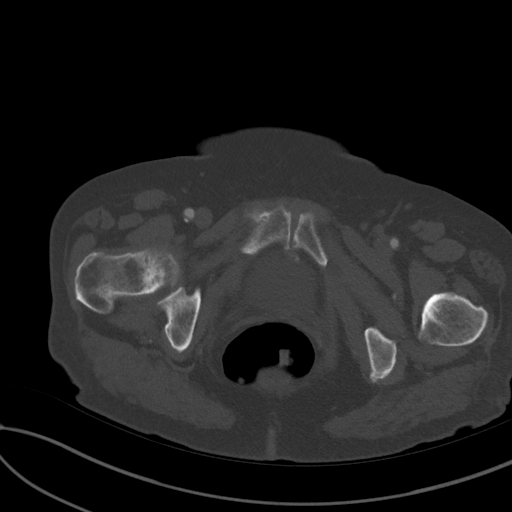
[im 38/200  mediastinal]
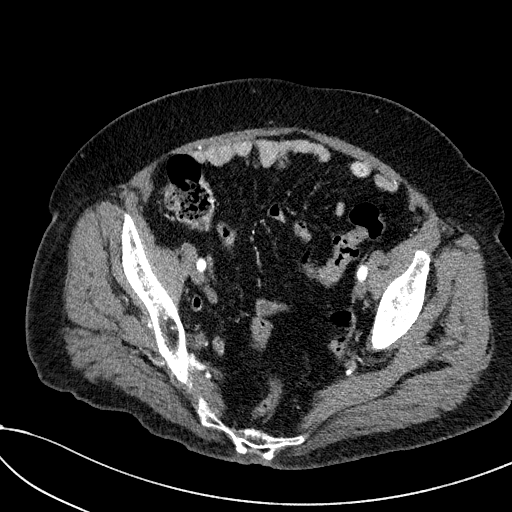
[im 63/200  mediastinal]
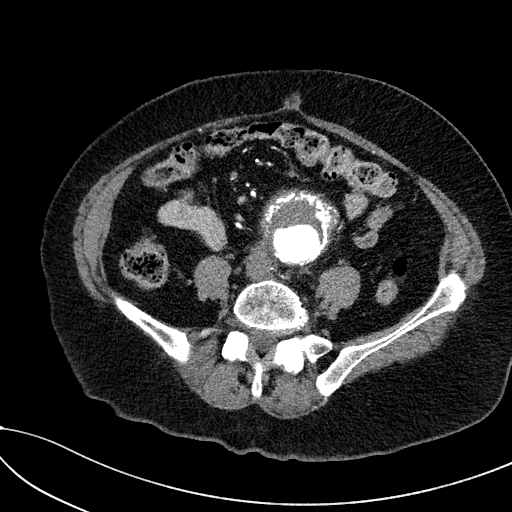
[im 88/200  mediastinal]
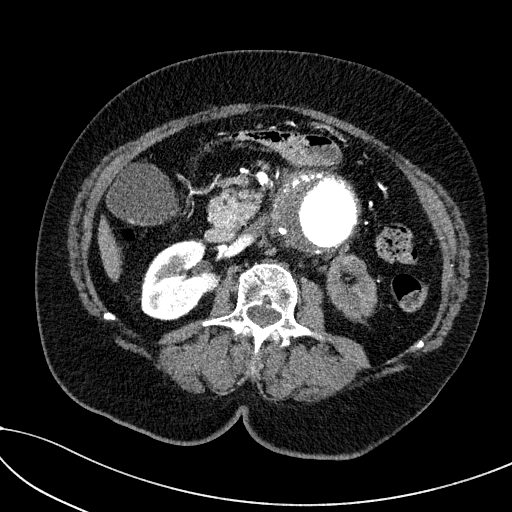
[im 112/200  mediastinal]
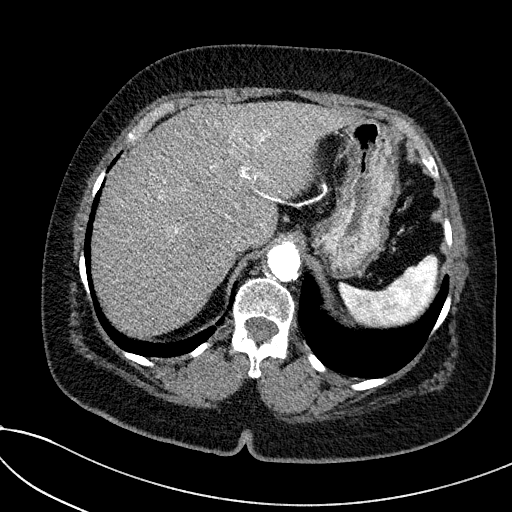
[im 137/200  mediastinal]
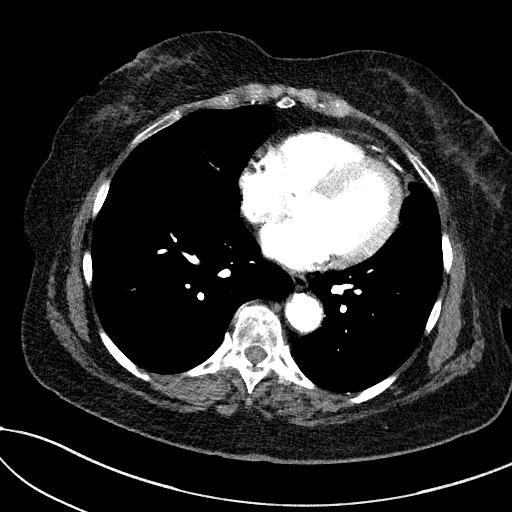
[im 150/200  lung]
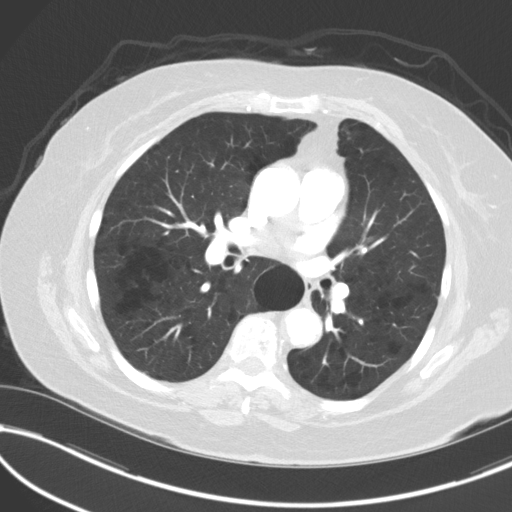
[im 162/200  mediastinal]
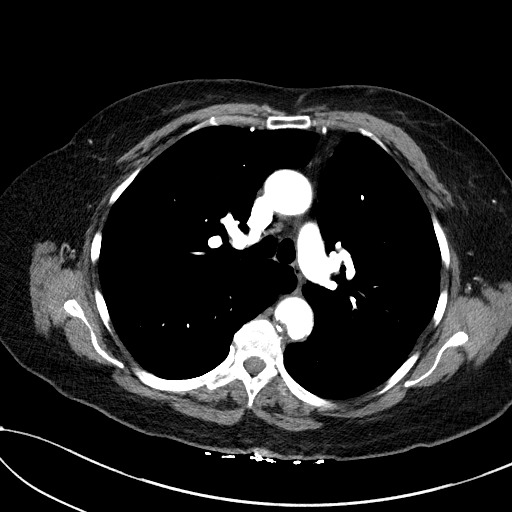
[im 162/200  lung]
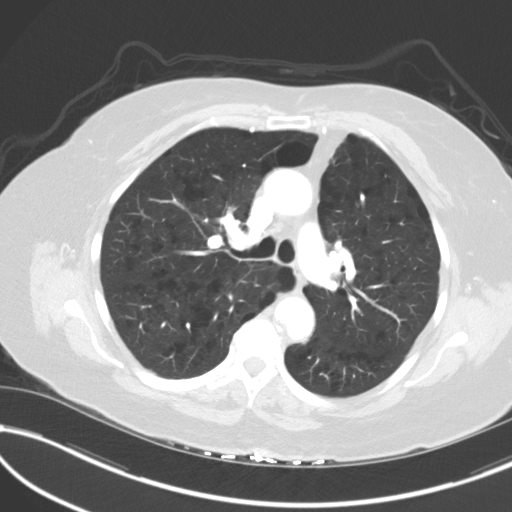
[im 175/200  lung]
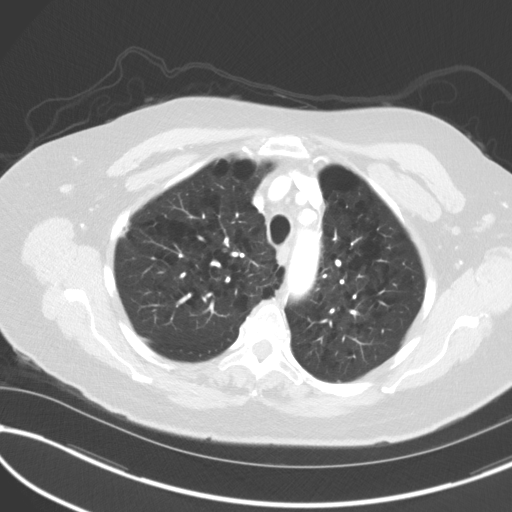
[im 187/200  mediastinal]
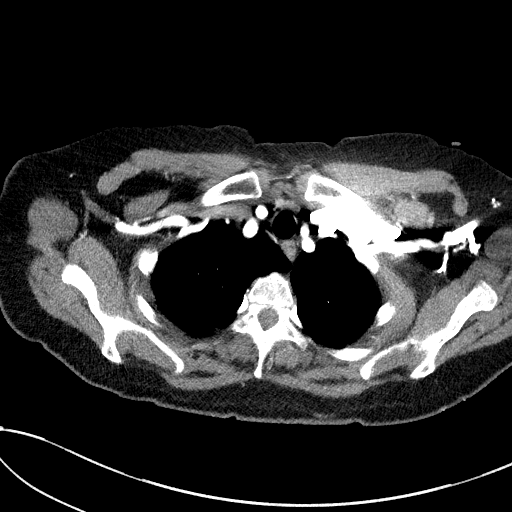
[im 187/200  lung]
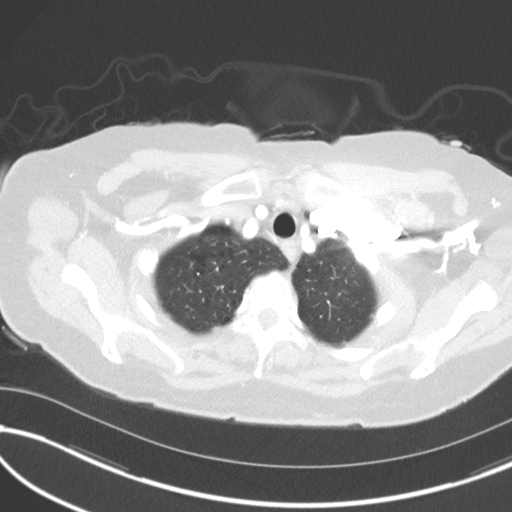

[Series 9: coronals · coronal · 0.65mm/px · 1 of 146 slices shown]
[im 73/146  mediastinal]
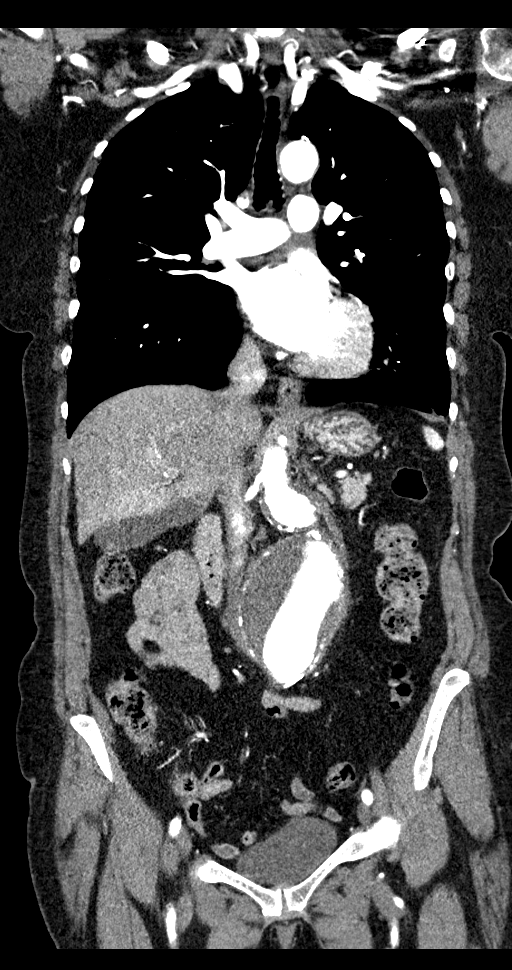

[11 of 36 positions shown; findings below may reference images not displayed]

FINDINGS: CT CHEST FINDINGS

Cardiovascular:

Heart:

No cardiomegaly. No pericardial fluid/thickening. Calcifications of
left main, left anterior descending, right coronary arteries.

Aorta:

Atherosclerotic changes of the thoracic aorta. No dissection flap.
No periaortic fluid. No aneurysm of the thoracic aorta. Significant
irregular soft plaque of the descending thoracic aorta.

Pulmonary arteries:

No central, lobar, segmental, or proximal subsegmental filling
defects.

Mediastinum/Nodes: Mediastinal lymph nodes are present, none of
which are enlarged by CT size criteria. Unremarkable appearance of
the thoracic esophagus.

Unremarkable appearance of the thoracic inlet and thyroid.

Lungs/Pleura: No confluent airspace disease. Paraseptal and
centrilobular emphysema of the bilateral lungs. No pleural effusion.
No significant and a bronchial thickening. 5 mm nodule of the
lingula with no comparison. 4 mm nodule at the medial left lung
base.

No pneumothorax.

Musculoskeletal: No displaced fracture. Degenerative changes of the
spine.

Review of the MIP images confirms the above findings.

CT ABDOMEN PELVIS FINDINGS

VASCULAR

Aorta: Infrarenal abdominal aortic aneurysm with greatest diameter
on the axial images measuring 8.0 cm. Mural calcifications are
present with thickened aortic wall circumferentially beyond the
mural calcifications. This is most pronounced at the level of the
greatest dilation and extending inferiorly towards the aortic
bifurcation. Mural thrombus present within the aneurysm sac
approximately 75% of the circumference with flow lumen maintained.
Diameter at the aorta at the renal arteries measures 2.5 cm with
irregular plaque.

No periaortic fluid.  No dissection flap.

Celiac: Atherosclerotic changes at the origin of the celiac artery
which remains patent, with likely 50% or greater stenosis. Accessory
left hepatic artery. Splenic artery remains patent.

SMA: Atherosclerotic changes at the origin of the superior
mesenteric artery. No significant stenosis or occlusion. Replaced
right hepatic artery.

Renals: Left renal artery occluded at the origin with dense soft
plaque and calcifications. Atherosclerotic changes at the origin of
the right renal artery with at least 50% stenosis.

Partial opacification of the left renal artery, potentially
secondary to collateral flow.

IMA: Inferior mesenteric artery appears occluded at the origin.

Right lower extremity:

Atherosclerotic changes extend from the aortic bifurcation to the
right iliac artery. No aneurysm or dissection flap. Hypogastric
artery remains patent. External iliac artery patent. Common femoral
artery patent. Proximal femoral vasculature patent.

Left lower extremity:

Atherosclerotic changes extend from the bifurcation into the left
common iliac artery. No significant stenosis or occlusion. No
dissection. No iliac aneurysm. Hypogastric artery remains patent.
External iliac artery patent. Common femoral artery patent including
the proximal femoral system.

Veins: Unremarkable appearance of the venous system.

Review of the MIP images confirms the above findings.

NON-VASCULAR

Hepatobiliary: Unremarkable appearance of the liver. Unremarkable
gall bladder.

Pancreas: Unremarkable appearance of the pancreas. No
pericholecystic fluid or inflammatory changes. Unremarkable ductal
system.

Spleen: Unremarkable.

Adrenals/Urinary Tract: Bilateral adrenal glands unremarkable.

Right kidney without hydronephrosis or nephrolithiasis. Low-density
lesion at the inferior right cortex, incompletely characterized. No
nephrolithiasis or perinephric fluid.

Left kidney demonstrates no perfusion compared to the right. Trace
perinephric stranding. Diameter of the left kidney is slightly
reduced compared to the right. Left measures 8.1 cm and right
measures 11.7 cm.

Stomach/Bowel: Unremarkable appearance of stomach, small bowel. No
abnormal distention. Colonic diverticula without evidence of acute
diverticulitis. Appendix is not visualized, however, no inflammatory
changes are present adjacent to the cecum to indicate an
appendicitis.

Lymphatic: Multiple lymph nodes in the para-aortic nodal station,
none of which are enlarged.

Mesenteric: No free fluid or air. No adenopathy.

Reproductive: Hysterectomy

Other: No abdominal wall hernia.

Musculoskeletal: No displaced fracture. Mild degenerative changes of
the spine. No bony canal narrowing. Minimal degenerative changes of
the hips.
IMPRESSION: Although there are no specific findings of acute aortic syndrome,
there is an infrarenal abdominal aortic aneurysm measuring 8 cm, and
the aortic wall appears somewhat thickened/irregular, potentially
inflammatory/reactive. Vascular consultation is recommended.

The left renal artery is occluded, with left kidney hypoperfusion.
The left kidney measures 8.1 cm compared to the right measuring
cm. Although the chronicity uncertain, findings may represent acute
renal artery thrombosis superimposed on chronic stenosis, and could
represent a source of left-sided abdominal pain.

The above results were called by telephone at the time of
interpretation on 04/05/2016 at [DATE] to Dr. ZURDO CROWDER , who
verbally acknowledged these results.

Aortic atherosclerosis with severe mural soft plaque throughout the
length of the thoracic aorta and abdominal aorta including
significant mural thrombus/ plaque within the aneurysm sac.

At least 50% stenosis of celiac artery origin secondary to
atherosclerotic changes. There is also occlusion of the inferior
mesenteric artery origin.

Nodule of the lingula measuring 5 mm and nodule of the left lower
lobe measuring 4 mm. Given the patient risk factors, CT follow-up in
6 months is recommended, as suggested by the updated [employer] guidelines.

Diverticular disease without evidence of acute diverticulitis

## 2018-01-01 DIAGNOSIS — H40013 Open angle with borderline findings, low risk, bilateral: Secondary | ICD-10-CM | POA: Diagnosis not present

## 2018-01-07 DIAGNOSIS — E559 Vitamin D deficiency, unspecified: Secondary | ICD-10-CM | POA: Diagnosis not present

## 2018-02-06 IMAGING — US US ABDOMEN COMPLETE
1 series · 13 of 25 positions shown · non-contrast
Comparison: None.

CLINICAL DATA: Upper abdominal pain for the past 1-2 days.

EXAM:
ABDOMEN ULTRASOUND COMPLETE

[Series 1: us abdomen complete · 0.22mm/px · 13 of 110 slices shown]
[im 1/110]
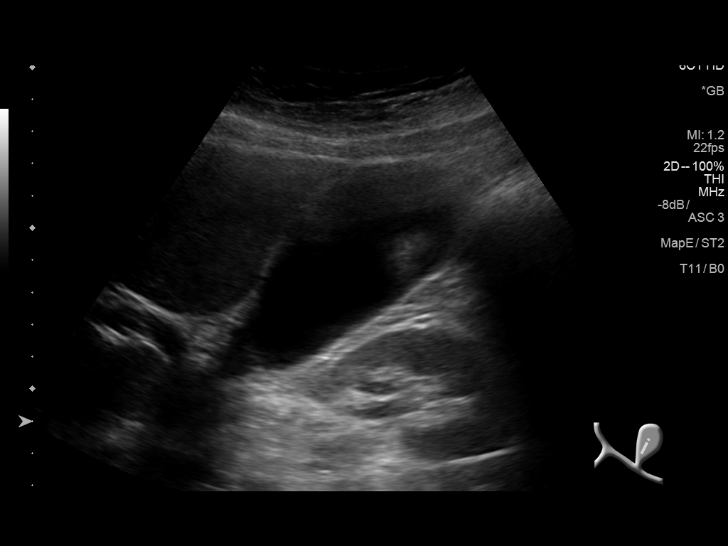
[im 10/110]
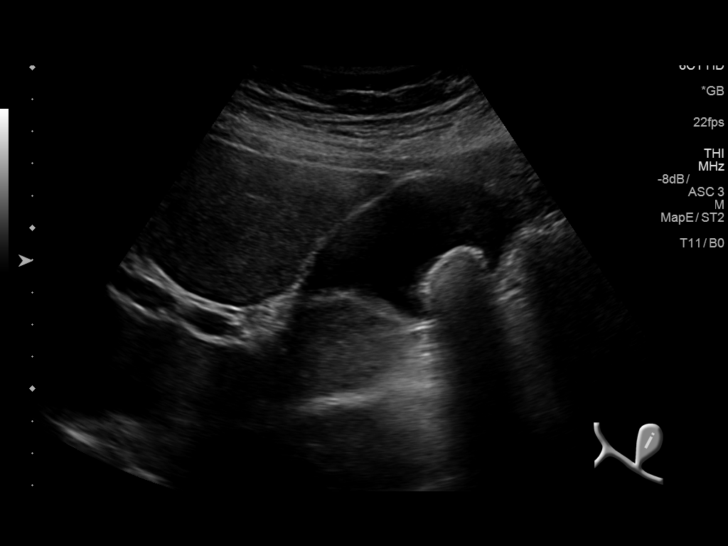
[im 19/110]
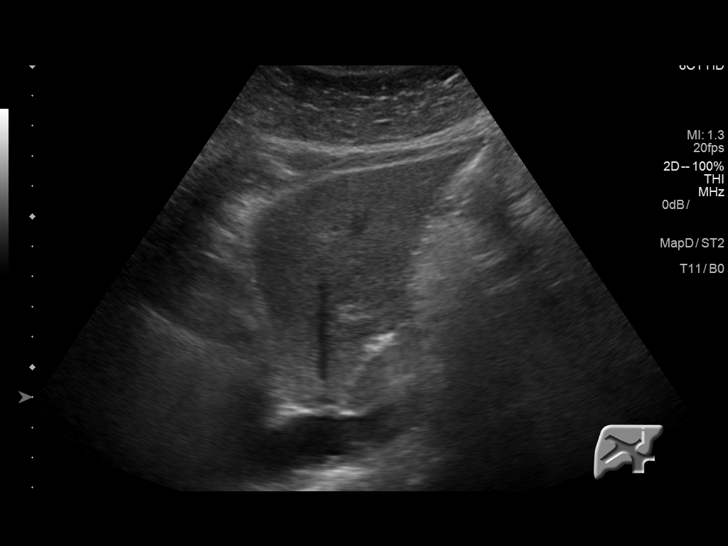
[im 28/110]
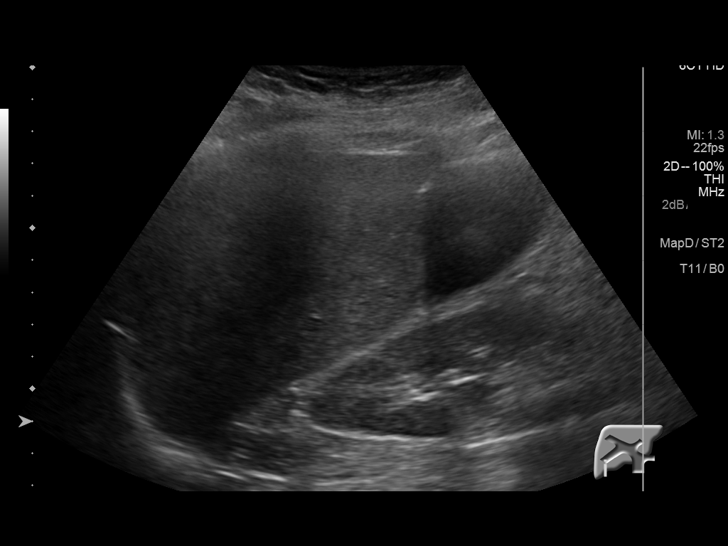
[im 37/110]
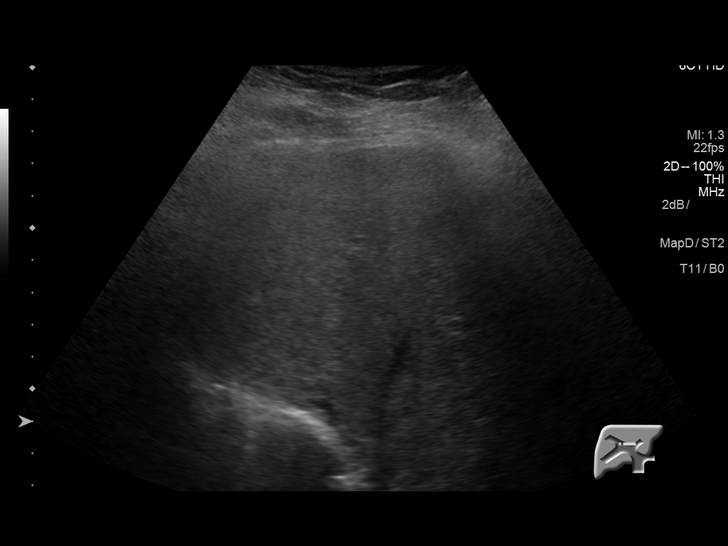
[im 46/110]
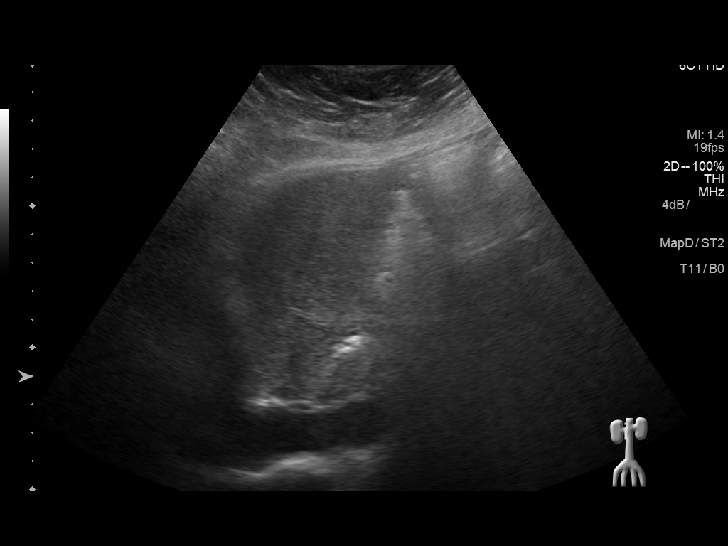
[im 55/110]
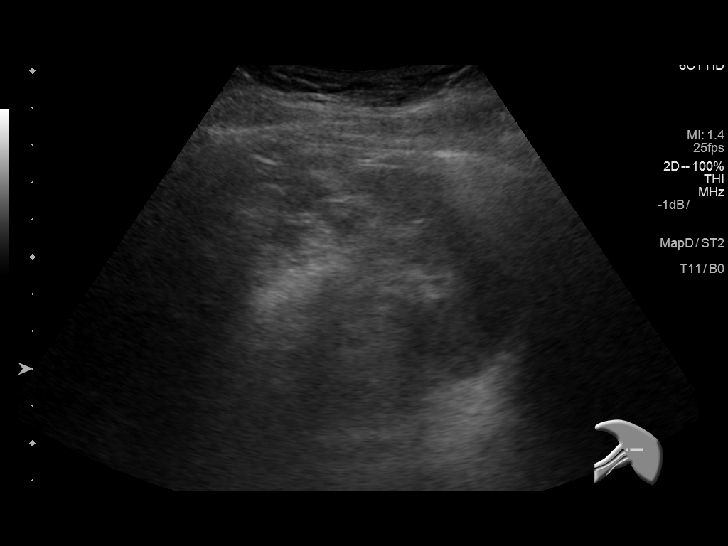
[im 64/110]
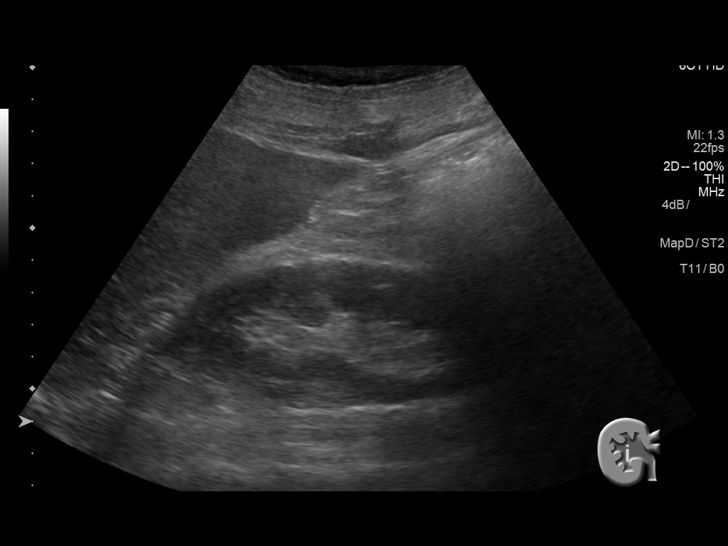
[im 73/110]
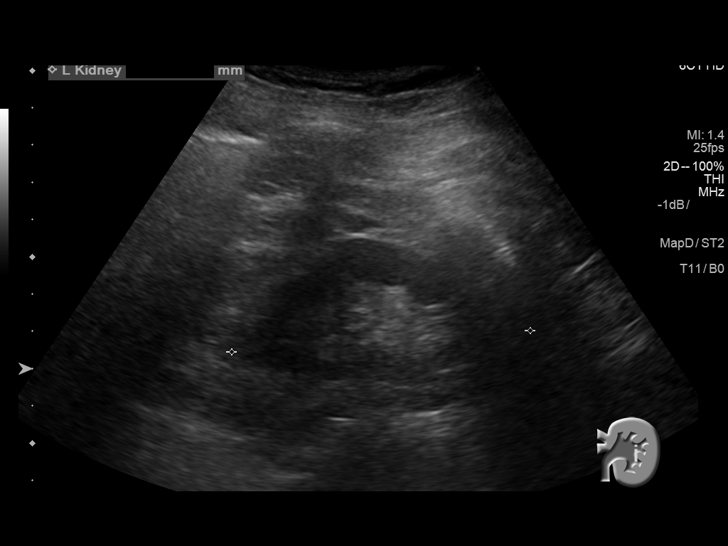
[im 82/110]
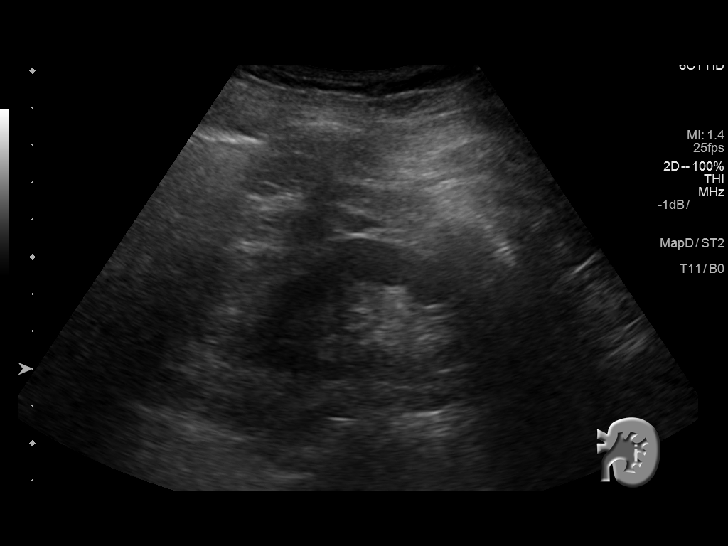
[im 91/110]
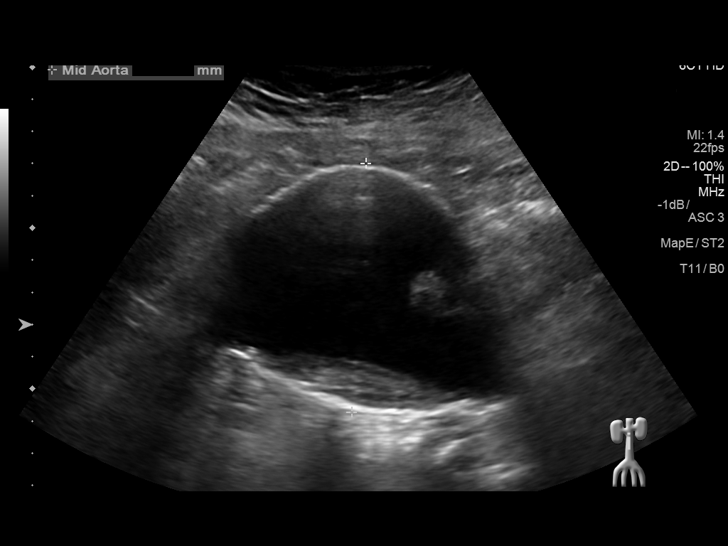
[im 100/110]
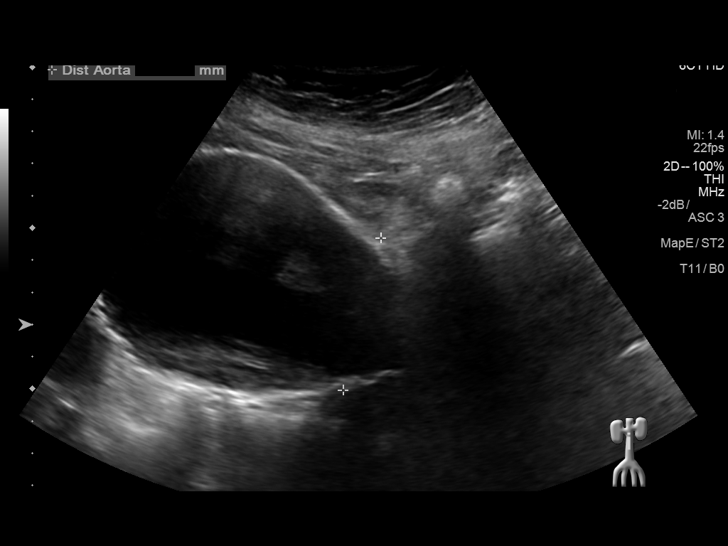
[im 110/110]
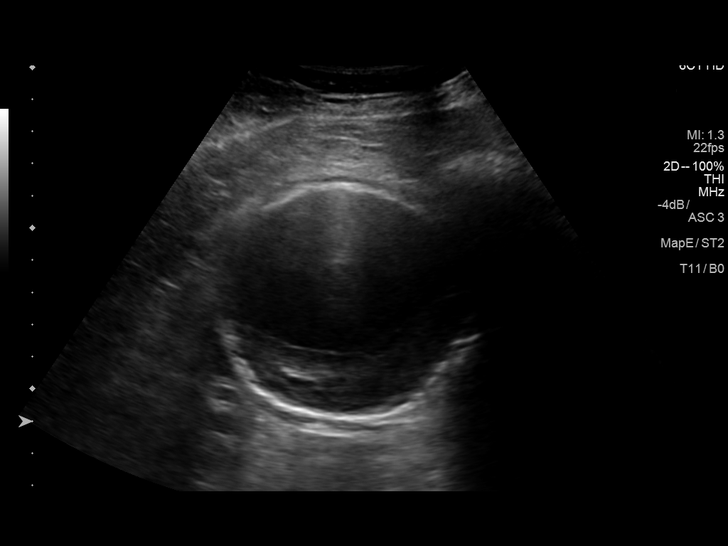

[13 of 25 positions shown; findings below may reference images not displayed]

FINDINGS: Gallbladder: There is a approximately 2.9 cm echogenic shadowing
stone within the fundus of an otherwise normal-appearing
gallbladder. No gallbladder wall thickening or pericholecystic
fluid. Negative sonographic Murphy's sign.

Common bile duct: Normal in size measuring 2.3 mm in diameter

Liver: Homogeneous hepatic echotexture. No discrete hepatic lesions.
No definite evidence of intrahepatic biliary ductal dilatation. No
ascites.

IVC: No abnormality visualized.

Pancreas: Visualized portion unremarkable.

Spleen: Normal in size measuring 2.9 cm in length

Right Kidney: Normal cortical thickness, echogenicity and size,
measuring 11.5 cm in length. No focal renal lesions. No echogenic
renal stones. No urinary obstruction.

Left Kidney: Normal cortical thickness, echogenicity and size,
measuring 8.5 cm in length. No focal renal lesions. No echogenic
renal stones. No urinary obstruction.

Abdominal aorta: There is fusiform aneurysmal dilatation of the
abdominal aorta measuring at least 8 cm in diameter with crescentic
mural thrombus (image 103). The abdominal aorta appears to taper to
a normal caliber at the level the bifurcation, however visualization
is difficult secondary to bowel gas.

Other findings: None.
IMPRESSION: 1. Cholelithiasis without evidence of cholecystitis.
2. Large (at least 8 cm) abdominal aortic aneurysm with crescentic
mural thrombus. Further evaluation with CTA of the abdomen pelvis is
recommended.
Critical Value/emergent results were called by telephone at the time
of interpretation on 04/05/2016 at [DATE] to Dr. AWITOR MANSEH , who
verbally acknowledged these results.

## 2018-04-01 NOTE — Progress Notes (Signed)
Cardiology Office Note  Date: 04/02/2018   ID: Taylor Long, DOB 15-Dec-1940, MRN [id number]  PCP: Benita Stabile, MD  Primary Cardiologist: Nona Dell, MD   Chief Complaint  Patient presents with  . Coronary Artery Disease    History of Present Illness: Taylor Long is a 78 y.o. female last seen in September 2019.  She is here for a routine visit.  She does not report any angina symptoms, but does feel intermittent palpitations.  She has had no dizziness or syncope.  I reviewed her medications which are outlined below.  She reports compliance and no obvious intolerances.  Her last echocardiogram was in 2018 at which point LVEF was 45 to 50% with mild diastolic dysfunction.  We discussed obtaining a follow-up study.  She continues to follow with Dr. Margo Aye for primary care.  Past Medical History:  Diagnosis Date  . CAD (coronary artery disease)    Cardiac catheterization March 2018 showed 80% proximal circumflex and otherwise nonobstructive disease, managed medically  . Cardiomyopathy (HCC)    LVEF 45-50% March 2018  . Essential hypertension   . Hyperlipidemia     Past Surgical History:  Procedure Laterality Date  . ABDOMINAL AORTIC ANEURYSM REPAIR N/A 04/12/2016   Procedure: ANEURYSM ABDOMINAL AORTIC REPAIR;  Surgeon: Chuck Hint, MD;  Location: [facility] OR;  Service: Vascular;  Laterality: N/A;  . LEFT HEART CATH AND CORONARY ANGIOGRAPHY N/A 04/09/2016   Procedure: Left Heart Cath and Coronary Angiography;  Surgeon: Marykay Lex, MD;  Location: [facility] INVASIVE CV LAB;  Service: Cardiovascular;  Laterality: N/A;    Current Outpatient Medications  Medication Sig Dispense Refill  . amLODipine (NORVASC) 5 MG tablet Take 1 tablet (5 mg total) by mouth 2 (two) times daily. 60 tablet 2  . aspirin EC 81 MG tablet Take 1 tablet (81 mg total) by mouth daily.    Marland Kitchen atorvastatin (LIPITOR) 20 MG tablet TAKE 1 TABLET BY MOUTH ONCE DAILY 90 tablet 3  . cholestyramine (QUESTRAN) 4  GM/DOSE powder Take 4 g by mouth 2 (two) times daily with a meal.    . ibuprofen (ADVIL,MOTRIN) 200 MG tablet Take 200 mg by mouth every 6 (six) hours as needed for moderate pain.    Marland Kitchen LORazepam (ATIVAN) 0.5 MG tablet Take 0.25-0.5 mg by mouth 2 (two) times daily as needed for anxiety.    Marland Kitchen losartan (COZAAR) 50 MG tablet Take 1 tablet by mouth daily.    . metoprolol (LOPRESSOR) 50 MG tablet Take 1 tablet (50 mg total) by mouth 2 (two) times daily. 60 tablet 2  . ondansetron (ZOFRAN) 4 MG tablet Take 1 tablet (4 mg total) by mouth every 8 (eight) hours as needed for nausea or vomiting. 10 tablet 0  . Vitamin D, Ergocalciferol, (DRISDOL) 50000 units CAPS capsule Take 50,000 Units by mouth every 7 (seven) days.     No current facility-administered medications for this visit.    Allergies:  Crestor [rosuvastatin calcium] and Latex   Social History: The patient  reports that she has quit smoking. She has never used smokeless tobacco. She reports that she does not drink alcohol or use drugs.   ROS:  Please see the history of present illness. Otherwise, complete review of systems is positive for hearing loss.  All other systems are reviewed and negative.   Physical Exam: VS:  BP 134/74 (BP Location: Left Arm)   Pulse 78   Ht 5\' 7"  (1.702 m)   Wt 183  lb (83 kg)   SpO2 93%   BMI 28.66 kg/m , BMI Body mass index is 28.66 kg/m.  Wt Readings from Last 3 Encounters:  04/02/18 183 lb (83 kg)  10/02/17 179 lb (81.2 kg)  08/27/17 175 lb (79.4 kg)    General: Elderly woman, appears comfortable at rest. HEENT: Conjunctiva and lids normal, oropharynx clear. Neck: Supple, no elevated JVP or carotid bruits, no thyromegaly. Lungs: Clear to auscultation, nonlabored breathing at rest. Cardiac: Regular rate and rhythm, no S3 or significant systolic murmur. Abdomen: Soft, nontender, bowel sounds present. Extremities: No pitting edema, distal pulses 2+. Skin: Warm and dry. Musculoskeletal: No  kyphosis. Neuropsychiatric: Alert and oriented x3, affect grossly appropriate.  ECG: I personally reviewed the tracing from 10/02/2017 which showed sinus rhythm with low voltage and nonspecific T wave changes.  Recent Labwork:  March 2019: Cholesterol 102, HDL 44, triglycerides 95, LDL 40, BUN 19, creatinine 1.12, potassium 3.7, AST 18, ALT 16, hemoglobin 13.3, platelets 292, hemoglobin A1c 6.6%  Other Studies Reviewed Today:  Cardiac catheterization 04/09/2016:  Prox Cx lesion, 80 %stenosed. - Focal napkin ring calcified lesion. Would be difficult PCI target  Ost LM to Prox LAD - dense moderate calcification  Prox RCA lesion, 25 %stenosed - diffusely calcified  The left ventricular ejection fraction is 55-65% by visual estimate.  LV end diastolic pressure is normal.  Severe single vessel disease involving the proximal circumflex napkin ring lesion. This would be a very difficult target for PCI due to poor visualization, and calcification. Would be very difficult to advance equipment based on the angulation of the circumflex complex takeoff.  Echocardiogram3/12/2016: Study Conclusions  - Left ventricle: The cavity size was normal. Wall thickness was increased in a pattern of mild LVH. Systolic function was mildly reduced. The estimated ejection fraction was in the range of 45% to 50%. Doppler parameters are consistent with abnormal left ventricular relaxation (grade 1 diastolic dysfunction). The E/e&' ratio is between 8-15, suggesting indeterminate LV filling pressure. - Left atrium: The atrium was normal in size. - Atrial septum: There was increased thickness of the septum, consistent with lipomatous hypertrophy. - Inferior vena cava: The vessel was normal in size. The respirophasic diameter changes were in the normal range (>= 50%), consistent with normal central venous pressure.  Impressions:  - LVEF 45-50%, mild LVH, basal to mid inferior  hypokinesis, diastolic dysfunction, indeterminate LV filling pressure, normal LA size, normal IVC.  Assessment and Plan:  1.  Single-vessel obstructive CAD involving the proximal circumflex with heavy calcification, managed medically at this point.  She reports no active angina symptoms.  No changes were made today.  2.  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  Follow-up echocardiogram will be obtained.  She is currently on Cozaar and Lopressor.  3.  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  Continues to follow with Dr. Margo Aye.  Last LDL was 40.  4.  Abdominal aortic aneurysm status post open repair in March 2018.  She is asymptomatic and continues to follow with Dr. Edilia Bo.  Current medicines were reviewed with the patient today.   Orders Placed This Encounter  Procedures  . ECHOCARDIOGRAM COMPLETE    Disposition: Follow-up in 6 months.  Signed, Jonelle Sidle, MD, [facility] 04/02/2018 11:17 AM    [facility] at [facility] [address], [place], [place] [address] Phone: [phone]; Fax: [phone]

## 2018-04-02 ENCOUNTER — Ambulatory Visit (INDEPENDENT_AMBULATORY_CARE_PROVIDER_SITE_OTHER): Payer: Medicare Other | Admitting: Cardiology

## 2018-04-02 ENCOUNTER — Encounter: Payer: Self-pay | Admitting: Cardiology

## 2018-04-02 VITALS — BP 134/74 | HR 78 | Ht 67.0 in | Wt 183.0 lb

## 2018-04-02 DIAGNOSIS — Z9889 Other specified postprocedural states: Secondary | ICD-10-CM

## 2018-04-02 DIAGNOSIS — E782 Mixed hyperlipidemia: Secondary | ICD-10-CM | POA: Diagnosis not present

## 2018-04-02 DIAGNOSIS — I429 Cardiomyopathy, unspecified: Secondary | ICD-10-CM | POA: Diagnosis not present

## 2018-04-02 DIAGNOSIS — I25119 Atherosclerotic heart disease of native coronary artery with unspecified angina pectoris: Secondary | ICD-10-CM

## 2018-04-02 DIAGNOSIS — Z8679 Personal history of other diseases of the circulatory system: Secondary | ICD-10-CM | POA: Diagnosis not present

## 2018-04-02 NOTE — Patient Instructions (Signed)
Medication Instructions: Your physician recommends that you continue on your current medications as directed. Please refer to the Current Medication list given to you today.   Labwork: None today  Procedures/Testing: Your physician has requested that you have an echocardiogram. Echocardiography is a painless test that uses sound waves to create images of your heart. It provides your doctor with information about the size and shape of your heart and how well your heart's chambers and valves are working. This procedure takes approximately one hour. There are no restrictions for this procedure.    Follow-Up: 6 months with Dr.McDowell  Any Additional Special Instructions Will Be Listed Below (If Applicable).     If you need a refill on your cardiac medications before your next appointment, please call your pharmacy.   

## 2018-04-07 ENCOUNTER — Other Ambulatory Visit: Payer: Self-pay

## 2018-04-07 ENCOUNTER — Ambulatory Visit (HOSPITAL_COMMUNITY)
Admission: RE | Admit: 2018-04-07 | Discharge: 2018-04-07 | Disposition: A | Discharge status: Home / Self Care | Payer: Medicare Other | Source: Ambulatory Visit | Attending: Cardiology | Admitting: Cardiology

## 2018-04-07 DIAGNOSIS — I119 Hypertensive heart disease without heart failure: Secondary | ICD-10-CM | POA: Insufficient documentation

## 2018-04-07 DIAGNOSIS — I2510 Atherosclerotic heart disease of native coronary artery without angina pectoris: Secondary | ICD-10-CM | POA: Diagnosis not present

## 2018-04-07 DIAGNOSIS — I429 Cardiomyopathy, unspecified: Secondary | ICD-10-CM | POA: Insufficient documentation

## 2018-04-07 DIAGNOSIS — E119 Type 2 diabetes mellitus without complications: Secondary | ICD-10-CM | POA: Insufficient documentation

## 2018-04-07 DIAGNOSIS — F17200 Nicotine dependence, unspecified, uncomplicated: Secondary | ICD-10-CM | POA: Insufficient documentation

## 2018-04-07 DIAGNOSIS — E785 Hyperlipidemia, unspecified: Secondary | ICD-10-CM | POA: Insufficient documentation

## 2018-04-07 NOTE — Progress Notes (Signed)
*  PRELIMINARY RESULTS* Echocardiogram 2D Echocardiogram has been performed.  Taylor Long, Taylor Long 04/07/2018, 12:56 PM

## 2018-05-13 DIAGNOSIS — N183 Chronic kidney disease, stage 3 (moderate): Secondary | ICD-10-CM | POA: Diagnosis not present

## 2018-05-13 DIAGNOSIS — R197 Diarrhea, unspecified: Secondary | ICD-10-CM | POA: Diagnosis not present

## 2018-05-13 DIAGNOSIS — E1122 Type 2 diabetes mellitus with diabetic chronic kidney disease: Secondary | ICD-10-CM | POA: Diagnosis not present

## 2018-05-13 DIAGNOSIS — I129 Hypertensive chronic kidney disease with stage 1 through stage 4 chronic kidney disease, or unspecified chronic kidney disease: Secondary | ICD-10-CM | POA: Diagnosis not present

## 2018-05-13 DIAGNOSIS — E785 Hyperlipidemia, unspecified: Secondary | ICD-10-CM | POA: Diagnosis not present

## 2018-05-19 ENCOUNTER — Other Ambulatory Visit: Payer: Self-pay | Admitting: Cardiology

## 2018-05-19 MED ORDER — ATORVASTATIN CALCIUM 20 MG PO TABS: 20.0000 mg | ORAL_TABLET | Freq: Every day | ORAL | 3 refills | Status: AC

## 2018-05-19 NOTE — Telephone Encounter (Signed)
°*  STAT* If patient is at the pharmacy, call can be transferred to refill team.   1. Which medications need to be refilled? atorvastatin (LIPITOR) 20 MG tablet    2. Which pharmacy/location (including street and city if local pharmacy) is medication to be sent to? [facility] [place]  3. Do they need a 30 day or 90 day supply?   [place] that she needs authorization

## 2018-05-19 NOTE — Telephone Encounter (Signed)
Refilled atorvastatin to [facility]

## 2018-06-04 DIAGNOSIS — Z0000 Encounter for general adult medical examination without abnormal findings: Secondary | ICD-10-CM | POA: Diagnosis not present

## 2018-08-26 DIAGNOSIS — E119 Type 2 diabetes mellitus without complications: Secondary | ICD-10-CM | POA: Diagnosis not present

## 2018-09-01 DIAGNOSIS — Z1211 Encounter for screening for malignant neoplasm of colon: Secondary | ICD-10-CM | POA: Diagnosis not present

## 2018-09-14 DIAGNOSIS — I10 Essential (primary) hypertension: Secondary | ICD-10-CM | POA: Diagnosis not present

## 2018-09-14 DIAGNOSIS — E1122 Type 2 diabetes mellitus with diabetic chronic kidney disease: Secondary | ICD-10-CM | POA: Diagnosis not present

## 2018-09-14 DIAGNOSIS — N189 Chronic kidney disease, unspecified: Secondary | ICD-10-CM | POA: Diagnosis not present

## 2018-09-14 DIAGNOSIS — I714 Abdominal aortic aneurysm, without rupture: Secondary | ICD-10-CM | POA: Diagnosis not present

## 2018-10-26 DIAGNOSIS — N183 Chronic kidney disease, stage 3 (moderate): Secondary | ICD-10-CM | POA: Diagnosis not present

## 2018-10-26 DIAGNOSIS — E1122 Type 2 diabetes mellitus with diabetic chronic kidney disease: Secondary | ICD-10-CM | POA: Diagnosis not present

## 2018-10-26 DIAGNOSIS — E785 Hyperlipidemia, unspecified: Secondary | ICD-10-CM | POA: Diagnosis not present

## 2018-10-26 DIAGNOSIS — R197 Diarrhea, unspecified: Secondary | ICD-10-CM | POA: Diagnosis not present

## 2018-10-26 DIAGNOSIS — I129 Hypertensive chronic kidney disease with stage 1 through stage 4 chronic kidney disease, or unspecified chronic kidney disease: Secondary | ICD-10-CM | POA: Diagnosis not present

## 2018-10-29 ENCOUNTER — Telehealth: Payer: Self-pay | Admitting: Cardiology

## 2018-10-29 NOTE — Telephone Encounter (Signed)

## 2018-11-13 ENCOUNTER — Telehealth (INDEPENDENT_AMBULATORY_CARE_PROVIDER_SITE_OTHER): Payer: Medicare Other | Admitting: Cardiology

## 2018-11-13 ENCOUNTER — Other Ambulatory Visit: Payer: Self-pay

## 2018-11-13 ENCOUNTER — Encounter: Payer: Self-pay | Admitting: Cardiology

## 2018-11-13 VITALS — BP 141/79 | HR 69 | Ht 67.0 in | Wt 181.0 lb

## 2018-11-13 DIAGNOSIS — E782 Mixed hyperlipidemia: Secondary | ICD-10-CM

## 2018-11-13 DIAGNOSIS — I25119 Atherosclerotic heart disease of native coronary artery with unspecified angina pectoris: Secondary | ICD-10-CM | POA: Diagnosis not present

## 2018-11-13 DIAGNOSIS — Z8679 Personal history of other diseases of the circulatory system: Secondary | ICD-10-CM

## 2018-11-13 DIAGNOSIS — I429 Cardiomyopathy, unspecified: Secondary | ICD-10-CM

## 2018-11-13 NOTE — Patient Instructions (Signed)
Medication Instructions:  Your physician recommends that you continue on your current medications as directed. Please refer to the Current Medication list given to you today.   Labwork: NONE  Testing/Procedures: NONE  Follow-Up: Your physician wants you to follow-up in: 6 Months with Dr. McDowell. You will receive a reminder letter in the mail two months in advance. If you don't receive a letter, please call our office to schedule the follow-up appointment.   Any Other Special Instructions Will Be Listed Below (If Applicable).     If you need a refill on your cardiac medications before your next appointment, please call your pharmacy. Thank you for choosing [facility]!    

## 2018-11-13 NOTE — Progress Notes (Signed)
Virtual Visit via Telephone Note   This visit type was conducted due to national recommendations for restrictions regarding the COVID-19 Pandemic (e.g. social distancing) in an effort to limit this patient's exposure and mitigate transmission in our community.  Due to her co-morbid illnesses, this patient is at least at moderate risk for complications without adequate follow up.  This format is felt to be most appropriate for this patient at this time.  The patient did not have access to video technology/had technical difficulties with video requiring transitioning to audio format only (telephone).  All issues noted in this document were discussed and addressed.  No physical exam could be performed with this format.  Please refer to the patient's chart for her  consent to telehealth for [facility].   Date:  11/13/2018   ID:  Taylor Long, DOB 01-Jan-1941, MRN [id number]  Patient Location: Home Provider Location: Office  PCP:  Celene Squibb, MD  Cardiologist:  Rozann Lesches, MD Electrophysiologist:  None   Evaluation Performed:  Follow-Up Visit  Chief Complaint:   Cardiac follow-up  History of Present Illness:    Taylor Long is a 78 y.o. female last seen in March.  We spoke by phone today.  She tells me that she has been staying around her house mostly during the pandemic, really only goes out to shop.  She wears a mask when she goes out.  She has gained weight with less activity.  We talked about her diet and a walking plan.  Follow-up echocardiogram in March revealed normal LVEF in the range of 55 to 60%.  I reviewed this with her today.  We also went over her medications which are outlined below.  She has not had follow-up with PCP yet this year.  I talked with her about trying to get a visit scheduled for repeat lab work.  Last lipid panel looked fairly good with LDL at 40 as of March 2019.  The patient does not have symptoms concerning for COVID-19 infection (fever, chills,  cough, or new shortness of breath).    Past Medical History:  Diagnosis Date  . CAD (coronary artery disease)    Cardiac catheterization March 2018 showed 80% proximal circumflex and otherwise nonobstructive disease, managed medically  . Cardiomyopathy ([place])    LVEF 45-50% March 2018  . Essential hypertension   . Hyperlipidemia    Past Surgical History:  Procedure Laterality Date  . ABDOMINAL AORTIC ANEURYSM REPAIR N/A 04/12/2016   Procedure: ANEURYSM ABDOMINAL AORTIC REPAIR;  Surgeon: Angelia Mould, MD;  Location: [facility];  Service: Vascular;  Laterality: N/A;  . LEFT HEART CATH AND CORONARY ANGIOGRAPHY N/A 04/09/2016   Procedure: Left Heart Cath and Coronary Angiography;  Surgeon: Leonie Man, MD;  Location: [facility];  Service: Cardiovascular;  Laterality: N/A;     Current Meds  Medication Sig  . amLODipine (NORVASC) 5 MG tablet Take 1 tablet (5 mg total) by mouth 2 (two) times daily.  Marland Kitchen aspirin EC 81 MG tablet Take 1 tablet (81 mg total) by mouth daily.  Marland Kitchen atorvastatin (LIPITOR) 20 MG tablet Take 1 tablet (20 mg total) by mouth daily.  . cholestyramine (QUESTRAN) 4 GM/DOSE powder Take 4 g by mouth 2 (two) times daily with a meal.  . ibuprofen (ADVIL,MOTRIN) 200 MG tablet Take 200 mg by mouth every 6 (six) hours as needed for moderate pain.  Marland Kitchen LORazepam (ATIVAN) 0.5 MG tablet Take 0.25-0.5 mg by mouth 2 (two) times  daily as needed for anxiety.  Marland Kitchen losartan (COZAAR) 50 MG tablet Take 1 tablet by mouth daily.  . metoprolol (LOPRESSOR) 50 MG tablet Take 1 tablet (50 mg total) by mouth 2 (two) times daily.  . Vitamin D, Ergocalciferol, (DRISDOL) 50000 units CAPS capsule Take 50,000 Units by mouth every 7 (seven) days.     Allergies:   Crestor [rosuvastatin calcium] and Latex   Social History   Tobacco Use  . Smoking status: Former Games developer  . Smokeless tobacco: Never Used  Substance Use Topics  . Alcohol use: No  . Drug use: No     Family Hx: The patient's  family history includes Diabetes in her brother; Hypertension in her father, mother, and sister; Stroke in her father.  ROS:   Please see the history of present illness. All other systems reviewed and are negative.   Prior CV studies:   The following studies were reviewed today:  Echocardiogram 04/07/2018:  1. The left ventricle has normal systolic function, with an ejection fraction of 55-60%. The cavity size was normal. There is mild basal septal hypertrophy. Left ventricular diastolic Doppler parameters are consistent with impaired relaxation.  Indeterminate filling pressures No evidence of left ventricular regional wall motion abnormalities.  2. The right ventricle has normal systolic function. The cavity was normal. There is no increase in right ventricular wall thickness.  3. The tricuspid valve is grossly normal.  4. The aortic valve is tricuspid.  5. The pulmonic valve was grossly normal. Pulmonic valve regurgitation is mild by color flow Doppler.  6. The aortic root is normal in size and structure.  Labs/Other Tests and Data Reviewed:    EKG:  An ECG dated 10/02/2017 was personally reviewed today and demonstrated:  Sinus rhythm with nonspecific T wave changes.  Recent Labs:  March 2019: Cholesterol 102, HDL 44, triglycerides 95, LDL 40, BUN 19, creatinine 1.12, potassium 3.7, AST 18, ALT 16, hemoglobin A1c 6.6%, hemoglobin 13.3, platelets 292  Wt Readings from Last 3 Encounters:  11/13/18 181 lb (82.1 kg)  04/02/18 183 lb (83 kg)  10/02/17 179 lb (81.2 kg)     Objective:    Vital Signs:  BP (!) 141/79   Pulse 69   Ht 5\' 7"  (1.702 m)   Wt 181 lb (82.1 kg)   BMI 28.35 kg/m    Patient spoke in full sentences, not short of breath. No audible wheezing or coughing. Speech pattern normal.  ASSESSMENT & PLAN:    1.  CAD with heavily calcified proximal circumflex that has been managed medically.  She does not report any progressive angina symptoms on medical therapy which  includes aspirin, statin, ARB, and Norvasc.  Plan to continue with observation.  We will get an ECG for her next office visit.  2.  Mixed hyperlipidemia, on Lipitor and Questran.  Last LDL 40.  3.  History of cardiomyopathy, LVEF normal at 55 to 60% by echocardiogram in March of this year.  COVID-19 Education: The signs and symptoms of COVID-19 were discussed with the patient and how to seek care for testing (follow up with PCP or arrange E-visit).  The importance of social distancing was discussed today.  Time:   Today, I have spent 5 minutes with the patient with telehealth technology discussing the above problems.     Medication Adjustments/Labs and Tests Ordered: Current medicines are reviewed at length with the patient today.  Concerns regarding medicines are outlined above.   Tests Ordered: No orders of the defined  types were placed in this encounter.   Medication Changes: No orders of the defined types were placed in this encounter.   Follow Up:  Either In Person or Virtual 6 months.  Signed, Nona DellSamuel Nisreen Guise, MD  11/13/2018 1:23 PM    [facility]

## 2018-11-27 DIAGNOSIS — I714 Abdominal aortic aneurysm, without rupture: Secondary | ICD-10-CM | POA: Diagnosis not present

## 2018-11-27 DIAGNOSIS — E1122 Type 2 diabetes mellitus with diabetic chronic kidney disease: Secondary | ICD-10-CM | POA: Diagnosis not present

## 2018-11-27 DIAGNOSIS — R197 Diarrhea, unspecified: Secondary | ICD-10-CM | POA: Diagnosis not present

## 2018-11-27 DIAGNOSIS — E785 Hyperlipidemia, unspecified: Secondary | ICD-10-CM | POA: Diagnosis not present

## 2018-11-27 DIAGNOSIS — I129 Hypertensive chronic kidney disease with stage 1 through stage 4 chronic kidney disease, or unspecified chronic kidney disease: Secondary | ICD-10-CM | POA: Diagnosis not present

## 2018-12-02 DIAGNOSIS — I129 Hypertensive chronic kidney disease with stage 1 through stage 4 chronic kidney disease, or unspecified chronic kidney disease: Secondary | ICD-10-CM | POA: Diagnosis not present

## 2018-12-02 DIAGNOSIS — E1122 Type 2 diabetes mellitus with diabetic chronic kidney disease: Secondary | ICD-10-CM | POA: Diagnosis not present

## 2018-12-02 DIAGNOSIS — E785 Hyperlipidemia, unspecified: Secondary | ICD-10-CM | POA: Diagnosis not present

## 2018-12-02 DIAGNOSIS — I714 Abdominal aortic aneurysm, without rupture: Secondary | ICD-10-CM | POA: Diagnosis not present

## 2018-12-23 DIAGNOSIS — E1165 Type 2 diabetes mellitus with hyperglycemia: Secondary | ICD-10-CM | POA: Diagnosis not present

## 2018-12-23 DIAGNOSIS — E785 Hyperlipidemia, unspecified: Secondary | ICD-10-CM | POA: Diagnosis not present

## 2018-12-23 DIAGNOSIS — N183 Chronic kidney disease, stage 3 unspecified: Secondary | ICD-10-CM | POA: Diagnosis not present

## 2018-12-23 DIAGNOSIS — E1122 Type 2 diabetes mellitus with diabetic chronic kidney disease: Secondary | ICD-10-CM | POA: Diagnosis not present

## 2018-12-23 DIAGNOSIS — E782 Mixed hyperlipidemia: Secondary | ICD-10-CM | POA: Diagnosis not present

## 2018-12-28 DIAGNOSIS — E1122 Type 2 diabetes mellitus with diabetic chronic kidney disease: Secondary | ICD-10-CM | POA: Diagnosis not present

## 2018-12-28 DIAGNOSIS — I714 Abdominal aortic aneurysm, without rupture: Secondary | ICD-10-CM | POA: Diagnosis not present

## 2018-12-28 DIAGNOSIS — I129 Hypertensive chronic kidney disease with stage 1 through stage 4 chronic kidney disease, or unspecified chronic kidney disease: Secondary | ICD-10-CM | POA: Diagnosis not present

## 2018-12-30 DIAGNOSIS — I714 Abdominal aortic aneurysm, without rupture: Secondary | ICD-10-CM | POA: Diagnosis not present

## 2018-12-30 DIAGNOSIS — I129 Hypertensive chronic kidney disease with stage 1 through stage 4 chronic kidney disease, or unspecified chronic kidney disease: Secondary | ICD-10-CM | POA: Diagnosis not present

## 2018-12-30 DIAGNOSIS — E1122 Type 2 diabetes mellitus with diabetic chronic kidney disease: Secondary | ICD-10-CM | POA: Diagnosis not present

## 2019-03-22 DIAGNOSIS — E785 Hyperlipidemia, unspecified: Secondary | ICD-10-CM | POA: Diagnosis not present

## 2019-03-22 DIAGNOSIS — N183 Chronic kidney disease, stage 3 unspecified: Secondary | ICD-10-CM | POA: Diagnosis not present

## 2019-03-22 DIAGNOSIS — R197 Diarrhea, unspecified: Secondary | ICD-10-CM | POA: Diagnosis not present

## 2019-03-22 DIAGNOSIS — E1122 Type 2 diabetes mellitus with diabetic chronic kidney disease: Secondary | ICD-10-CM | POA: Diagnosis not present

## 2019-03-22 DIAGNOSIS — I129 Hypertensive chronic kidney disease with stage 1 through stage 4 chronic kidney disease, or unspecified chronic kidney disease: Secondary | ICD-10-CM | POA: Diagnosis not present

## 2019-04-06 DIAGNOSIS — H40023 Open angle with borderline findings, high risk, bilateral: Secondary | ICD-10-CM | POA: Diagnosis not present

## 2019-04-14 DIAGNOSIS — I714 Abdominal aortic aneurysm, without rupture: Secondary | ICD-10-CM | POA: Diagnosis not present

## 2019-04-14 DIAGNOSIS — R197 Diarrhea, unspecified: Secondary | ICD-10-CM | POA: Diagnosis not present

## 2019-04-14 DIAGNOSIS — R5383 Other fatigue: Secondary | ICD-10-CM | POA: Diagnosis not present

## 2019-04-14 DIAGNOSIS — E1122 Type 2 diabetes mellitus with diabetic chronic kidney disease: Secondary | ICD-10-CM | POA: Diagnosis not present

## 2019-04-14 DIAGNOSIS — I129 Hypertensive chronic kidney disease with stage 1 through stage 4 chronic kidney disease, or unspecified chronic kidney disease: Secondary | ICD-10-CM | POA: Diagnosis not present

## 2019-04-19 DIAGNOSIS — N1831 Chronic kidney disease, stage 3a: Secondary | ICD-10-CM | POA: Diagnosis not present

## 2019-04-19 DIAGNOSIS — I129 Hypertensive chronic kidney disease with stage 1 through stage 4 chronic kidney disease, or unspecified chronic kidney disease: Secondary | ICD-10-CM | POA: Diagnosis not present

## 2019-04-19 DIAGNOSIS — I714 Abdominal aortic aneurysm, without rupture: Secondary | ICD-10-CM | POA: Diagnosis not present

## 2019-04-19 DIAGNOSIS — E1122 Type 2 diabetes mellitus with diabetic chronic kidney disease: Secondary | ICD-10-CM | POA: Diagnosis not present

## 2019-04-20 DIAGNOSIS — R197 Diarrhea, unspecified: Secondary | ICD-10-CM | POA: Diagnosis not present

## 2019-04-20 DIAGNOSIS — N183 Chronic kidney disease, stage 3 unspecified: Secondary | ICD-10-CM | POA: Diagnosis not present

## 2019-04-20 DIAGNOSIS — E785 Hyperlipidemia, unspecified: Secondary | ICD-10-CM | POA: Diagnosis not present

## 2019-04-20 DIAGNOSIS — E1122 Type 2 diabetes mellitus with diabetic chronic kidney disease: Secondary | ICD-10-CM | POA: Diagnosis not present

## 2019-04-20 DIAGNOSIS — I129 Hypertensive chronic kidney disease with stage 1 through stage 4 chronic kidney disease, or unspecified chronic kidney disease: Secondary | ICD-10-CM | POA: Diagnosis not present

## 2019-05-05 ENCOUNTER — Ambulatory Visit: Payer: Medicare Other | Admitting: Cardiology

## 2019-05-06 ENCOUNTER — Encounter: Payer: Self-pay | Admitting: Cardiology

## 2019-05-06 ENCOUNTER — Ambulatory Visit (INDEPENDENT_AMBULATORY_CARE_PROVIDER_SITE_OTHER): Payer: Medicare Other | Admitting: Cardiology

## 2019-05-06 ENCOUNTER — Other Ambulatory Visit: Payer: Self-pay

## 2019-05-06 VITALS — BP 140/76 | HR 70 | Ht 66.0 in | Wt 176.8 lb

## 2019-05-06 DIAGNOSIS — Z8679 Personal history of other diseases of the circulatory system: Secondary | ICD-10-CM

## 2019-05-06 DIAGNOSIS — I25119 Atherosclerotic heart disease of native coronary artery with unspecified angina pectoris: Secondary | ICD-10-CM

## 2019-05-06 DIAGNOSIS — E782 Mixed hyperlipidemia: Secondary | ICD-10-CM | POA: Diagnosis not present

## 2019-05-06 NOTE — Addendum Note (Signed)
Addended by: Kerney Elbe on: 05/06/2019 02:56 PM   Modules accepted: Orders

## 2019-05-06 NOTE — Patient Instructions (Signed)
Medication Instructions:  Your physician recommends that you continue on your current medications as directed. Please refer to the Current Medication list given to you today.  *If you need a refill on your cardiac medications before your next appointment, please call your pharmacy*   Lab Work: NONE   If you have labs (blood work) drawn today and your tests are completely normal, you will receive your results only by: MyChart Message (if you have MyChart) OR A paper copy in the mail If you have any lab test that is abnormal or we need to change your treatment, we will call you to review the results.   Testing/Procedures: NONE    Follow-Up: At [facility], you and your health needs are our priority.  As part of our continuing mission to provide you with exceptional heart care, we have created designated Provider Care Teams.  These Care Teams include your primary Cardiologist (physician) and Advanced Practice Providers (APPs -  Physician Assistants and Nurse Practitioners) who all work together to provide you with the care you need, when you need it.  We recommend signing up for the patient portal called "MyChart".  Sign up information is provided on this After Visit Summary.  MyChart is used to connect with patients for Virtual Visits (Telemedicine).  Patients are able to view lab/test results, encounter notes, upcoming appointments, etc.  Non-urgent messages can be sent to your provider as well.   To learn more about what you can do with MyChart, go to [email or web address].    Your next appointment:   1 year(s)  The format for your next appointment:   In Person  Provider:   Samuel McDowell, MD   Other Instructions Thank you for choosing [facility]!    

## 2019-05-06 NOTE — Progress Notes (Signed)
Cardiology Office Note  Date: 05/06/2019   ID: Taylor Long, DOB December 30, 1940, MRN [id number]  PCP:  Celene Squibb, MD  Cardiologist:  Rozann Lesches, MD Electrophysiologist:  None   Chief Complaint  Patient presents with  . Cardiac follow-up    History of Present Illness: Taylor Long is a 79 y.o. female last assessed via telehealth encounter in October 2020. She presents for a routine visit. She tells me that she has not had any angina symptoms on medical therapy. She does not drive, walks to the bus stop regularly from her house, describes NYHA class II dyspnea.  I reviewed her medications. Cardiac regimen includes aspirin, Norvasc, Lipitor, HCTZ, Cozaar, and Lopressor. I personally reviewed her ECG today which shows sinus rhythm with nonspecific T wave changes.  I reviewed her recent lab work as outlined below.  Follow-up echocardiogram from last year showed normal LVEF at 55 to 60%.  Past Medical History:  Diagnosis Date  . CAD (coronary artery disease)    Cardiac catheterization March 2018 showed 80% proximal circumflex and otherwise nonobstructive disease, managed medically  . Cardiomyopathy ([place])    LVEF 45-50% March 2018  . Essential hypertension   . Hyperlipidemia     Past Surgical History:  Procedure Laterality Date  . ABDOMINAL AORTIC ANEURYSM REPAIR N/A 04/12/2016   Procedure: ANEURYSM ABDOMINAL AORTIC REPAIR;  Surgeon: Angelia Mould, MD;  Location: [place];  Service: Vascular;  Laterality: N/A;  . LEFT HEART CATH AND CORONARY ANGIOGRAPHY N/A 04/09/2016   Procedure: Left Heart Cath and Coronary Angiography;  Surgeon: Leonie Man, MD;  Location: [facility];  Service: Cardiovascular;  Laterality: N/A;    Current Outpatient Medications  Medication Sig Dispense Refill  . amLODipine (NORVASC) 5 MG tablet Take 1 tablet (5 mg total) by mouth 2 (two) times daily. 60 tablet 2  . aspirin EC 81 MG tablet Take 1 tablet (81 mg total) by mouth daily.      Marland Kitchen atorvastatin (LIPITOR) 20 MG tablet Take 1 tablet (20 mg total) by mouth daily. 90 tablet 3  . cholestyramine (QUESTRAN) 4 GM/DOSE powder Take 4 g by mouth 2 (two) times daily with a meal.    . hydrochlorothiazide (HYDRODIURIL) 12.5 MG tablet Take 12.5 mg by mouth daily.    Marland Kitchen ibuprofen (ADVIL,MOTRIN) 200 MG tablet Take 200 mg by mouth every 6 (six) hours as needed for moderate pain.    Marland Kitchen LORazepam (ATIVAN) 0.5 MG tablet Take 0.25-0.5 mg by mouth 2 (two) times daily as needed for anxiety.    Marland Kitchen losartan (COZAAR) 50 MG tablet Take 1 tablet by mouth daily.    . metFORMIN (GLUCOPHAGE) 500 MG tablet Take 500 mg by mouth daily.    . metoprolol (LOPRESSOR) 50 MG tablet Take 1 tablet (50 mg total) by mouth 2 (two) times daily. 60 tablet 2  . Vitamin D, Ergocalciferol, (DRISDOL) 50000 units CAPS capsule Take 50,000 Units by mouth every 7 (seven) days.     No current facility-administered medications for this visit.   Allergies:  Crestor [rosuvastatin calcium] and Latex   ROS:   Arthritic symptoms.  Physical Exam: VS:  BP 140/76   Pulse 70   Ht 5\' 6"  (1.676 m)   Wt 176 lb 12.8 oz (80.2 kg)   SpO2 96%   BMI 28.54 kg/m , BMI Body mass index is 28.54 kg/m.  Wt Readings from Last 3 Encounters:  05/06/19 176 lb 12.8 oz (80.2 kg)  11/13/18  181 lb (82.1 kg)  04/02/18 183 lb (83 kg)    General: Elderly woman, appears comfortable at rest. HEENT: Conjunctiva and lids normal, wearing a mask. Neck: Supple, no elevated JVP or carotid bruits, no thyromegaly. Lungs: Clear to auscultation, nonlabored breathing at rest. Cardiac: Regular rate and rhythm, no S3 or significant systolic murmur, no pericardial rub. Abdomen: Soft, nontender, bowel sounds present. Extremities: No pitting edema, distal pulses 2+.  ECG:  An ECG dated 10/02/2017 was personally reviewed today and demonstrated:  Sinus rhythm with nonspecific T wave changes.  Recent Labwork:  March 2021: Hemoglobin 13.5, platelets 258, BUN 27,  creatinine 1.0, potassium 3.9, AST 18, ALT 14, cholesterol 115, triglycerides 125, HDL 46, LDL 47, hemoglobin A1c 6.6%  Other Studies Reviewed Today:  Echocardiogram 04/07/2018: 1. The left ventricle has normal systolic function, with an ejection fraction of 55-60%. The cavity size was normal. There is mild basal septal hypertrophy. Left ventricular diastolic Doppler parameters are consistent with impaired relaxation.  Indeterminate filling pressures No evidence of left ventricular regional wall motion abnormalities. 2. The right ventricle has normal systolic function. The cavity was normal. There is no increase in right ventricular wall thickness. 3. The tricuspid valve is grossly normal. 4. The aortic valve is tricuspid. 5. The pulmonic valve was grossly normal. Pulmonic valve regurgitation is mild by color flow Doppler. 6. The aortic root is normal in size and structure.  Assessment and Plan:  1. Single-vessel CAD involving the proximal circumflex, heavily calcified, and doing well on medical therapy without angina symptoms. Continue aspirin, Norvasc, losartan, Lopressor, and Lipitor. ECG reviewed and stable.  2. History of secondary cardiomyopathy with normalization of LVEF, 55 to 60% by echocardiogram last year.  3. Mixed hyperlipidemia, she continues on Lipitor. Most recent LDL was well controlled at 47.  Medication Adjustments/Labs and Tests Ordered: Current medicines are reviewed at length with the patient today.  Concerns regarding medicines are outlined above.   Tests Ordered: No orders of the defined types were placed in this encounter.   Medication Changes: No orders of the defined types were placed in this encounter.   Disposition:  Follow up 1 year in the [place] office.  Signed, Jonelle Sidle, MD, [facility] 05/06/2019 11:42 AM    [facility] at [facility] [address] [place], [place] [address] Phone: [phone]; Fax: [phone]

## 2019-05-11 DIAGNOSIS — E785 Hyperlipidemia, unspecified: Secondary | ICD-10-CM | POA: Diagnosis not present

## 2019-05-11 DIAGNOSIS — N183 Chronic kidney disease, stage 3 unspecified: Secondary | ICD-10-CM | POA: Diagnosis not present

## 2019-05-11 DIAGNOSIS — I129 Hypertensive chronic kidney disease with stage 1 through stage 4 chronic kidney disease, or unspecified chronic kidney disease: Secondary | ICD-10-CM | POA: Diagnosis not present

## 2019-05-11 DIAGNOSIS — R197 Diarrhea, unspecified: Secondary | ICD-10-CM | POA: Diagnosis not present

## 2019-05-11 DIAGNOSIS — E1122 Type 2 diabetes mellitus with diabetic chronic kidney disease: Secondary | ICD-10-CM | POA: Diagnosis not present

## 2019-06-03 DIAGNOSIS — E1122 Type 2 diabetes mellitus with diabetic chronic kidney disease: Secondary | ICD-10-CM | POA: Diagnosis not present

## 2019-06-03 DIAGNOSIS — N183 Chronic kidney disease, stage 3 unspecified: Secondary | ICD-10-CM | POA: Diagnosis not present

## 2019-06-03 DIAGNOSIS — R197 Diarrhea, unspecified: Secondary | ICD-10-CM | POA: Diagnosis not present

## 2019-06-03 DIAGNOSIS — E785 Hyperlipidemia, unspecified: Secondary | ICD-10-CM | POA: Diagnosis not present

## 2019-06-03 DIAGNOSIS — I129 Hypertensive chronic kidney disease with stage 1 through stage 4 chronic kidney disease, or unspecified chronic kidney disease: Secondary | ICD-10-CM | POA: Diagnosis not present

## 2019-06-30 DIAGNOSIS — I129 Hypertensive chronic kidney disease with stage 1 through stage 4 chronic kidney disease, or unspecified chronic kidney disease: Secondary | ICD-10-CM | POA: Diagnosis not present

## 2019-06-30 DIAGNOSIS — R197 Diarrhea, unspecified: Secondary | ICD-10-CM | POA: Diagnosis not present

## 2019-06-30 DIAGNOSIS — N183 Chronic kidney disease, stage 3 unspecified: Secondary | ICD-10-CM | POA: Diagnosis not present

## 2019-06-30 DIAGNOSIS — E785 Hyperlipidemia, unspecified: Secondary | ICD-10-CM | POA: Diagnosis not present

## 2019-06-30 DIAGNOSIS — E1122 Type 2 diabetes mellitus with diabetic chronic kidney disease: Secondary | ICD-10-CM | POA: Diagnosis not present

## 2019-07-01 DIAGNOSIS — I10 Essential (primary) hypertension: Secondary | ICD-10-CM | POA: Diagnosis not present

## 2019-07-15 DIAGNOSIS — I10 Essential (primary) hypertension: Secondary | ICD-10-CM | POA: Diagnosis not present

## 2019-08-23 DIAGNOSIS — R197 Diarrhea, unspecified: Secondary | ICD-10-CM | POA: Diagnosis not present

## 2019-08-23 DIAGNOSIS — E1122 Type 2 diabetes mellitus with diabetic chronic kidney disease: Secondary | ICD-10-CM | POA: Diagnosis not present

## 2019-08-23 DIAGNOSIS — I714 Abdominal aortic aneurysm, without rupture: Secondary | ICD-10-CM | POA: Diagnosis not present

## 2019-08-23 DIAGNOSIS — R5383 Other fatigue: Secondary | ICD-10-CM | POA: Diagnosis not present

## 2019-08-23 DIAGNOSIS — I129 Hypertensive chronic kidney disease with stage 1 through stage 4 chronic kidney disease, or unspecified chronic kidney disease: Secondary | ICD-10-CM | POA: Diagnosis not present

## 2019-08-26 ENCOUNTER — Telehealth: Payer: Self-pay | Admitting: Cardiology

## 2019-08-26 DIAGNOSIS — I714 Abdominal aortic aneurysm, without rupture: Secondary | ICD-10-CM | POA: Diagnosis not present

## 2019-08-26 DIAGNOSIS — N1831 Chronic kidney disease, stage 3a: Secondary | ICD-10-CM | POA: Diagnosis not present

## 2019-08-26 DIAGNOSIS — I129 Hypertensive chronic kidney disease with stage 1 through stage 4 chronic kidney disease, or unspecified chronic kidney disease: Secondary | ICD-10-CM | POA: Diagnosis not present

## 2019-08-26 DIAGNOSIS — E1122 Type 2 diabetes mellitus with diabetic chronic kidney disease: Secondary | ICD-10-CM | POA: Diagnosis not present

## 2019-08-26 DIAGNOSIS — Z0001 Encounter for general adult medical examination with abnormal findings: Secondary | ICD-10-CM | POA: Diagnosis not present

## 2019-08-26 NOTE — Telephone Encounter (Signed)
Pt states that she was told to increase losartan to 100 mg daily. Pt states that the increase in medication caused chest pain and left kidney pain. Denies SOB but does c/o headache. Pt reports that she has since decreased Losartan to 50 mg daily. And Pt reports take HCTZ 25 mg and she has now decreased to 12.5 mg. Pt states that after decreasing medications that her pains went away.Current BP is 148/82 HR is 74.  Please advise.

## 2019-08-26 NOTE — Telephone Encounter (Signed)
New message    Pt c/o medication issue:  1. Name of Medication: losartan (COZAAR) 50 MG tablet  2. How are you currently taking this medication (dosage and times per day)? 1 pill a day - then she was taking 2 a day and that made her chest hurt  hydrochlorothiazide (HYDRODIURIL) 12.5 MG tablet doesn't seem to be helping, all she is doing is taking a bunch of pills?   3. Are you having a reaction (difficulty breathing--STAT)?  no  4. What is your medication issue? Dr Margo Aye told her to set up an appt with [place] because her bp is staying at 150/?  She said that she stopped taking the losartan 100 mg because [facility] told her the green pills were not good for her?

## 2019-08-27 DIAGNOSIS — E7849 Other hyperlipidemia: Secondary | ICD-10-CM | POA: Diagnosis not present

## 2019-08-27 DIAGNOSIS — E1122 Type 2 diabetes mellitus with diabetic chronic kidney disease: Secondary | ICD-10-CM | POA: Diagnosis not present

## 2019-08-27 DIAGNOSIS — I129 Hypertensive chronic kidney disease with stage 1 through stage 4 chronic kidney disease, or unspecified chronic kidney disease: Secondary | ICD-10-CM | POA: Diagnosis not present

## 2019-08-27 DIAGNOSIS — N1831 Chronic kidney disease, stage 3a: Secondary | ICD-10-CM | POA: Diagnosis not present

## 2019-08-27 NOTE — Telephone Encounter (Signed)
Suggest continue Cozaar 50 mg daily and HCTZ 12.5 mg daily if symptoms have resolved. Would keep an eye on blood pressure in case other adjustments need to be made and communicate with PCP as well.

## 2019-08-27 NOTE — Telephone Encounter (Signed)
Pt notified and note routed to PCP

## 2019-09-14 DIAGNOSIS — E1122 Type 2 diabetes mellitus with diabetic chronic kidney disease: Secondary | ICD-10-CM | POA: Diagnosis not present

## 2019-09-14 DIAGNOSIS — N1831 Chronic kidney disease, stage 3a: Secondary | ICD-10-CM | POA: Diagnosis not present

## 2019-09-14 DIAGNOSIS — I129 Hypertensive chronic kidney disease with stage 1 through stage 4 chronic kidney disease, or unspecified chronic kidney disease: Secondary | ICD-10-CM | POA: Diagnosis not present

## 2019-09-14 DIAGNOSIS — E7849 Other hyperlipidemia: Secondary | ICD-10-CM | POA: Diagnosis not present

## 2019-09-27 DIAGNOSIS — E7849 Other hyperlipidemia: Secondary | ICD-10-CM | POA: Diagnosis not present

## 2019-09-27 DIAGNOSIS — E1122 Type 2 diabetes mellitus with diabetic chronic kidney disease: Secondary | ICD-10-CM | POA: Diagnosis not present

## 2019-09-27 DIAGNOSIS — I129 Hypertensive chronic kidney disease with stage 1 through stage 4 chronic kidney disease, or unspecified chronic kidney disease: Secondary | ICD-10-CM | POA: Diagnosis not present

## 2019-09-27 DIAGNOSIS — N1831 Chronic kidney disease, stage 3a: Secondary | ICD-10-CM | POA: Diagnosis not present

## 2019-10-14 DIAGNOSIS — E1122 Type 2 diabetes mellitus with diabetic chronic kidney disease: Secondary | ICD-10-CM | POA: Diagnosis not present

## 2019-10-14 DIAGNOSIS — E7849 Other hyperlipidemia: Secondary | ICD-10-CM | POA: Diagnosis not present

## 2019-10-14 DIAGNOSIS — N1831 Chronic kidney disease, stage 3a: Secondary | ICD-10-CM | POA: Diagnosis not present

## 2019-10-14 DIAGNOSIS — I129 Hypertensive chronic kidney disease with stage 1 through stage 4 chronic kidney disease, or unspecified chronic kidney disease: Secondary | ICD-10-CM | POA: Diagnosis not present

## 2019-11-11 DIAGNOSIS — R197 Diarrhea, unspecified: Secondary | ICD-10-CM | POA: Diagnosis not present

## 2019-11-11 DIAGNOSIS — N183 Chronic kidney disease, stage 3 unspecified: Secondary | ICD-10-CM | POA: Diagnosis not present

## 2019-11-11 DIAGNOSIS — E785 Hyperlipidemia, unspecified: Secondary | ICD-10-CM | POA: Diagnosis not present

## 2019-11-11 DIAGNOSIS — E1122 Type 2 diabetes mellitus with diabetic chronic kidney disease: Secondary | ICD-10-CM | POA: Diagnosis not present

## 2019-11-11 DIAGNOSIS — I129 Hypertensive chronic kidney disease with stage 1 through stage 4 chronic kidney disease, or unspecified chronic kidney disease: Secondary | ICD-10-CM | POA: Diagnosis not present

## 2019-11-30 DIAGNOSIS — I129 Hypertensive chronic kidney disease with stage 1 through stage 4 chronic kidney disease, or unspecified chronic kidney disease: Secondary | ICD-10-CM | POA: Diagnosis not present

## 2019-11-30 DIAGNOSIS — N3000 Acute cystitis without hematuria: Secondary | ICD-10-CM | POA: Diagnosis not present

## 2019-11-30 DIAGNOSIS — E559 Vitamin D deficiency, unspecified: Secondary | ICD-10-CM | POA: Diagnosis not present

## 2019-12-02 DIAGNOSIS — R5383 Other fatigue: Secondary | ICD-10-CM | POA: Diagnosis not present

## 2019-12-02 DIAGNOSIS — I714 Abdominal aortic aneurysm, without rupture: Secondary | ICD-10-CM | POA: Diagnosis not present

## 2019-12-02 DIAGNOSIS — I129 Hypertensive chronic kidney disease with stage 1 through stage 4 chronic kidney disease, or unspecified chronic kidney disease: Secondary | ICD-10-CM | POA: Diagnosis not present

## 2019-12-02 DIAGNOSIS — R197 Diarrhea, unspecified: Secondary | ICD-10-CM | POA: Diagnosis not present

## 2019-12-02 DIAGNOSIS — E1122 Type 2 diabetes mellitus with diabetic chronic kidney disease: Secondary | ICD-10-CM | POA: Diagnosis not present

## 2019-12-07 DIAGNOSIS — I129 Hypertensive chronic kidney disease with stage 1 through stage 4 chronic kidney disease, or unspecified chronic kidney disease: Secondary | ICD-10-CM | POA: Diagnosis not present

## 2019-12-07 DIAGNOSIS — E1122 Type 2 diabetes mellitus with diabetic chronic kidney disease: Secondary | ICD-10-CM | POA: Diagnosis not present

## 2019-12-07 DIAGNOSIS — Z7189 Other specified counseling: Secondary | ICD-10-CM | POA: Diagnosis not present

## 2019-12-07 DIAGNOSIS — I714 Abdominal aortic aneurysm, without rupture: Secondary | ICD-10-CM | POA: Diagnosis not present

## 2019-12-14 DIAGNOSIS — Z7189 Other specified counseling: Secondary | ICD-10-CM | POA: Diagnosis not present

## 2019-12-14 DIAGNOSIS — I129 Hypertensive chronic kidney disease with stage 1 through stage 4 chronic kidney disease, or unspecified chronic kidney disease: Secondary | ICD-10-CM | POA: Diagnosis not present

## 2019-12-14 DIAGNOSIS — E782 Mixed hyperlipidemia: Secondary | ICD-10-CM | POA: Diagnosis not present

## 2019-12-14 DIAGNOSIS — E1122 Type 2 diabetes mellitus with diabetic chronic kidney disease: Secondary | ICD-10-CM | POA: Diagnosis not present

## 2019-12-14 DIAGNOSIS — N1831 Chronic kidney disease, stage 3a: Secondary | ICD-10-CM | POA: Diagnosis not present

## 2020-01-19 DIAGNOSIS — E782 Mixed hyperlipidemia: Secondary | ICD-10-CM | POA: Diagnosis not present

## 2020-01-19 DIAGNOSIS — I714 Abdominal aortic aneurysm, without rupture: Secondary | ICD-10-CM | POA: Diagnosis not present

## 2020-01-19 DIAGNOSIS — N1831 Chronic kidney disease, stage 3a: Secondary | ICD-10-CM | POA: Diagnosis not present

## 2020-01-19 DIAGNOSIS — I129 Hypertensive chronic kidney disease with stage 1 through stage 4 chronic kidney disease, or unspecified chronic kidney disease: Secondary | ICD-10-CM | POA: Diagnosis not present

## 2020-01-28 DIAGNOSIS — E1122 Type 2 diabetes mellitus with diabetic chronic kidney disease: Secondary | ICD-10-CM | POA: Diagnosis not present

## 2020-02-26 DIAGNOSIS — I129 Hypertensive chronic kidney disease with stage 1 through stage 4 chronic kidney disease, or unspecified chronic kidney disease: Secondary | ICD-10-CM | POA: Diagnosis not present

## 2020-02-26 DIAGNOSIS — E782 Mixed hyperlipidemia: Secondary | ICD-10-CM | POA: Diagnosis not present

## 2020-02-26 DIAGNOSIS — N1831 Chronic kidney disease, stage 3a: Secondary | ICD-10-CM | POA: Diagnosis not present

## 2020-03-10 DIAGNOSIS — E1122 Type 2 diabetes mellitus with diabetic chronic kidney disease: Secondary | ICD-10-CM | POA: Diagnosis not present

## 2020-03-10 DIAGNOSIS — R5383 Other fatigue: Secondary | ICD-10-CM | POA: Diagnosis not present

## 2020-03-10 DIAGNOSIS — R197 Diarrhea, unspecified: Secondary | ICD-10-CM | POA: Diagnosis not present

## 2020-03-10 DIAGNOSIS — I129 Hypertensive chronic kidney disease with stage 1 through stage 4 chronic kidney disease, or unspecified chronic kidney disease: Secondary | ICD-10-CM | POA: Diagnosis not present

## 2020-03-10 DIAGNOSIS — I714 Abdominal aortic aneurysm, without rupture: Secondary | ICD-10-CM | POA: Diagnosis not present

## 2020-03-15 DIAGNOSIS — I129 Hypertensive chronic kidney disease with stage 1 through stage 4 chronic kidney disease, or unspecified chronic kidney disease: Secondary | ICD-10-CM | POA: Diagnosis not present

## 2020-03-15 DIAGNOSIS — I714 Abdominal aortic aneurysm, without rupture: Secondary | ICD-10-CM | POA: Diagnosis not present

## 2020-03-15 DIAGNOSIS — E1122 Type 2 diabetes mellitus with diabetic chronic kidney disease: Secondary | ICD-10-CM | POA: Diagnosis not present

## 2020-03-15 DIAGNOSIS — G4700 Insomnia, unspecified: Secondary | ICD-10-CM | POA: Diagnosis not present

## 2020-03-15 DIAGNOSIS — E782 Mixed hyperlipidemia: Secondary | ICD-10-CM | POA: Diagnosis not present

## 2020-03-15 DIAGNOSIS — N1831 Chronic kidney disease, stage 3a: Secondary | ICD-10-CM | POA: Diagnosis not present

## 2020-03-27 DIAGNOSIS — I129 Hypertensive chronic kidney disease with stage 1 through stage 4 chronic kidney disease, or unspecified chronic kidney disease: Secondary | ICD-10-CM | POA: Diagnosis not present

## 2020-03-27 DIAGNOSIS — N1831 Chronic kidney disease, stage 3a: Secondary | ICD-10-CM | POA: Diagnosis not present

## 2020-03-27 DIAGNOSIS — E782 Mixed hyperlipidemia: Secondary | ICD-10-CM | POA: Diagnosis not present

## 2020-03-27 DIAGNOSIS — I714 Abdominal aortic aneurysm, without rupture: Secondary | ICD-10-CM | POA: Diagnosis not present

## 2020-04-05 DIAGNOSIS — R5383 Other fatigue: Secondary | ICD-10-CM | POA: Diagnosis not present

## 2020-04-05 DIAGNOSIS — N1831 Chronic kidney disease, stage 3a: Secondary | ICD-10-CM | POA: Diagnosis not present

## 2020-04-05 DIAGNOSIS — R197 Diarrhea, unspecified: Secondary | ICD-10-CM | POA: Diagnosis not present

## 2020-04-05 DIAGNOSIS — I129 Hypertensive chronic kidney disease with stage 1 through stage 4 chronic kidney disease, or unspecified chronic kidney disease: Secondary | ICD-10-CM | POA: Diagnosis not present

## 2020-04-05 DIAGNOSIS — I714 Abdominal aortic aneurysm, without rupture: Secondary | ICD-10-CM | POA: Diagnosis not present

## 2020-04-26 DIAGNOSIS — E785 Hyperlipidemia, unspecified: Secondary | ICD-10-CM | POA: Diagnosis not present

## 2020-04-26 DIAGNOSIS — E1122 Type 2 diabetes mellitus with diabetic chronic kidney disease: Secondary | ICD-10-CM | POA: Diagnosis not present

## 2020-04-26 DIAGNOSIS — I714 Abdominal aortic aneurysm, without rupture: Secondary | ICD-10-CM | POA: Diagnosis not present

## 2020-04-26 DIAGNOSIS — N183 Chronic kidney disease, stage 3 unspecified: Secondary | ICD-10-CM | POA: Diagnosis not present

## 2020-05-28 DIAGNOSIS — I129 Hypertensive chronic kidney disease with stage 1 through stage 4 chronic kidney disease, or unspecified chronic kidney disease: Secondary | ICD-10-CM | POA: Diagnosis not present

## 2020-05-28 DIAGNOSIS — E1165 Type 2 diabetes mellitus with hyperglycemia: Secondary | ICD-10-CM | POA: Diagnosis not present

## 2020-06-13 DIAGNOSIS — I10 Essential (primary) hypertension: Secondary | ICD-10-CM | POA: Diagnosis not present

## 2020-06-13 DIAGNOSIS — E119 Type 2 diabetes mellitus without complications: Secondary | ICD-10-CM | POA: Diagnosis not present

## 2020-06-15 DIAGNOSIS — I129 Hypertensive chronic kidney disease with stage 1 through stage 4 chronic kidney disease, or unspecified chronic kidney disease: Secondary | ICD-10-CM | POA: Diagnosis not present

## 2020-06-15 DIAGNOSIS — N1831 Chronic kidney disease, stage 3a: Secondary | ICD-10-CM | POA: Diagnosis not present

## 2020-06-15 DIAGNOSIS — Z87891 Personal history of nicotine dependence: Secondary | ICD-10-CM | POA: Diagnosis not present

## 2020-06-15 DIAGNOSIS — I714 Abdominal aortic aneurysm, without rupture: Secondary | ICD-10-CM | POA: Diagnosis not present

## 2020-06-15 DIAGNOSIS — E1122 Type 2 diabetes mellitus with diabetic chronic kidney disease: Secondary | ICD-10-CM | POA: Diagnosis not present

## 2020-06-15 DIAGNOSIS — R002 Palpitations: Secondary | ICD-10-CM | POA: Diagnosis not present

## 2020-06-15 DIAGNOSIS — E782 Mixed hyperlipidemia: Secondary | ICD-10-CM | POA: Diagnosis not present

## 2020-06-27 DIAGNOSIS — I10 Essential (primary) hypertension: Secondary | ICD-10-CM | POA: Diagnosis not present

## 2020-06-27 DIAGNOSIS — E7800 Pure hypercholesterolemia, unspecified: Secondary | ICD-10-CM | POA: Diagnosis not present

## 2020-07-27 DIAGNOSIS — E7800 Pure hypercholesterolemia, unspecified: Secondary | ICD-10-CM | POA: Diagnosis not present

## 2020-07-27 DIAGNOSIS — I10 Essential (primary) hypertension: Secondary | ICD-10-CM | POA: Diagnosis not present

## 2020-08-07 ENCOUNTER — Other Ambulatory Visit: Payer: Self-pay

## 2020-08-07 DIAGNOSIS — I714 Abdominal aortic aneurysm, without rupture, unspecified: Secondary | ICD-10-CM

## 2020-08-23 ENCOUNTER — Ambulatory Visit
Admission: RE | Admit: 2020-08-23 | Discharge: 2020-08-23 | Disposition: A | Discharge status: Home / Self Care | Payer: Medicare Other | Source: Ambulatory Visit | Attending: Vascular Surgery | Admitting: Vascular Surgery

## 2020-08-23 DIAGNOSIS — I714 Abdominal aortic aneurysm, without rupture, unspecified: Secondary | ICD-10-CM

## 2020-08-23 DIAGNOSIS — N280 Ischemia and infarction of kidney: Secondary | ICD-10-CM | POA: Diagnosis not present

## 2020-08-23 DIAGNOSIS — N281 Cyst of kidney, acquired: Secondary | ICD-10-CM | POA: Diagnosis not present

## 2020-08-23 DIAGNOSIS — N261 Atrophy of kidney (terminal): Secondary | ICD-10-CM | POA: Diagnosis not present

## 2020-08-23 MED ORDER — IOPAMIDOL (ISOVUE-370) INJECTION 76%
75.0000 mL | Freq: Once | INTRAVENOUS | Status: AC | PRN
  Administered 2020-08-23: 75 mL via INTRAVENOUS

## 2020-08-24 ENCOUNTER — Other Ambulatory Visit: Payer: Self-pay

## 2020-08-24 DIAGNOSIS — I714 Abdominal aortic aneurysm, without rupture, unspecified: Secondary | ICD-10-CM

## 2020-08-24 DIAGNOSIS — I2510 Atherosclerotic heart disease of native coronary artery without angina pectoris: Secondary | ICD-10-CM

## 2020-08-27 DIAGNOSIS — I10 Essential (primary) hypertension: Secondary | ICD-10-CM | POA: Diagnosis not present

## 2020-08-27 DIAGNOSIS — E7800 Pure hypercholesterolemia, unspecified: Secondary | ICD-10-CM | POA: Diagnosis not present

## 2020-08-30 ENCOUNTER — Encounter: Payer: Self-pay | Admitting: Vascular Surgery

## 2020-08-30 ENCOUNTER — Other Ambulatory Visit: Payer: Self-pay

## 2020-08-30 ENCOUNTER — Ambulatory Visit (INDEPENDENT_AMBULATORY_CARE_PROVIDER_SITE_OTHER): Payer: Medicare Other | Admitting: Vascular Surgery

## 2020-08-30 ENCOUNTER — Ambulatory Visit (HOSPITAL_COMMUNITY)
Admission: RE | Admit: 2020-08-30 | Discharge: 2020-08-30 | Disposition: A | Discharge status: Home / Self Care | Payer: Medicare Other | Source: Ambulatory Visit | Attending: Vascular Surgery | Admitting: Vascular Surgery

## 2020-08-30 VITALS — BP 115/72 | HR 68 | Temp 97.7°F | Resp 20 | Ht 66.0 in | Wt 172.0 lb

## 2020-08-30 DIAGNOSIS — Z48812 Encounter for surgical aftercare following surgery on the circulatory system: Secondary | ICD-10-CM | POA: Diagnosis not present

## 2020-08-30 DIAGNOSIS — I2510 Atherosclerotic heart disease of native coronary artery without angina pectoris: Secondary | ICD-10-CM | POA: Diagnosis not present

## 2020-08-30 DIAGNOSIS — I714 Abdominal aortic aneurysm, without rupture, unspecified: Secondary | ICD-10-CM

## 2020-08-30 NOTE — Progress Notes (Signed)
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  This shows no complicating features of her open repair.  She has been doing well.  I offered to see her back as needed however she would feel more comfortable if we follow her on a routine basis.  For this reason I we will arrange an office visit in 3 years.  I have encouraged her to stay as active as possible.  Fortunately she is not a smoker.  NEW LUNG NODULE:  An incidental finding on her CT angio of the abdomen and pelvis was a 1.2 x 1.0 cm right middle lobe nodule.  She will need a formal CT chest and also I have referred her to Dr. Nida Boatman Icard for further recommendations concerning her pulmonary nodule.   HPI:   Taylor Long is a pleasant 80 y.o. female who underwent open repair of a juxtarenal 8 sonometer abdominal aortic aneurysm in March 2018.  I last saw her on 08/27/2017.  She was doing well with no claudication symptoms.  Based on the Society of vascular surgery's guidelines I ordered a follow-up CT abdomen pelvis which would be her 5-year follow-up study.  Since I saw her last, she denies any history of claudication, rest pain, or nonhealing ulcers.  She is not a smoker.  She is on aspirin and is on a statin.  Past Medical History:  Diagnosis Date   CAD (coronary artery disease)    Cardiac catheterization March 2018 showed 80% proximal circumflex and otherwise nonobstructive disease, managed medically   Cardiomyopathy (HCC)    LVEF 45-50% March 2018   Essential hypertension    Hyperlipidemia    Pre-diabetes     Family History  Problem Relation Age of Onset   Hypertension Mother    Stroke Father    Hypertension Father    Hypertension Sister    Diabetes Brother     SOCIAL HISTORY: Social History   Tobacco Use   Smoking status:  Former   Smokeless tobacco: Never  Substance Use Topics   Alcohol use: No    Allergies  Allergen Reactions   Crestor [Rosuvastatin Calcium] Nausea Only   Latex Rash    Current Outpatient Medications  Medication Sig Dispense Refill   amLODipine-olmesartan (AZOR) 10-40 MG tablet Take 1 tablet by mouth daily.     aspirin EC 81 MG tablet Take 1 tablet (81 mg total) by mouth daily.     atorvastatin (LIPITOR) 20 MG tablet Take 1 tablet (20 mg total) by mouth daily. 90 tablet 3   carvedilol (COREG) 12.5 MG tablet Take 12.5 mg by mouth 2 (two) times daily.     cholestyramine (QUESTRAN) 4 GM/DOSE powder Take 4 g by mouth 2 (two) times daily with a meal.     hydrochlorothiazide (HYDRODIURIL) 12.5 MG tablet Take 12.5 mg by mouth daily.     ibuprofen (ADVIL,MOTRIN) 200 MG tablet Take 200 mg by mouth every 6 (six) hours as needed for moderate pain.     LORazepam (ATIVAN) 0.5 MG tablet Take 0.25-0.5 mg by mouth 2 (two) times daily as needed for anxiety.     losartan (COZAAR) 50 MG tablet Take 1 tablet by mouth daily.     metFORMIN (GLUCOPHAGE) 500 MG tablet Take 500 mg by mouth daily.  metoprolol (LOPRESSOR) 50 MG tablet Take 1 tablet (50 mg total) by mouth 2 (two) times daily. 60 tablet 2   Vitamin D, Ergocalciferol, (DRISDOL) 50000 units CAPS capsule Take 50,000 Units by mouth every 7 (seven) days.     No current facility-administered medications for this visit.    REVIEW OF SYSTEMS:  [X]  denotes positive finding, [ ]  denotes negative finding Cardiac  Comments:  Chest pain or chest pressure:    Shortness of breath upon exertion:    Short of breath when lying flat:    Irregular heart rhythm:        Vascular    Pain in calf, thigh, or hip brought on by ambulation:    Pain in feet at night that wakes you up from your sleep:     Blood clot in your veins:    Leg swelling:         Pulmonary    Oxygen at home:    Productive cough:     Wheezing:         Neurologic    Sudden weakness  in arms or legs:     Sudden numbness in arms or legs:     Sudden onset of difficulty speaking or slurred speech:    Temporary loss of vision in one eye:     Problems with dizziness:         Gastrointestinal    Blood in stool:     Vomited blood:         Genitourinary    Burning when urinating:     Blood in urine:        Psychiatric    Major depression:         Hematologic    Bleeding problems:    Problems with blood clotting too easily:        Skin    Rashes or ulcers:        Constitutional    Fever or chills:     PHYSICAL EXAM:   Vitals:   08/30/20 1013  BP: 115/72  Pulse: 68  Resp: 20  Temp: 97.7 F (36.5 C)  SpO2: 95%  Weight: 172 lb (78 kg)  Height: 5\' 6"  (1.676 m)    GENERAL: The patient is a well-nourished female, in no acute distress. The vital signs are documented above. CARDIAC: There is a regular rate and rhythm.  VASCULAR: I do not detect carotid bruits. She has palpable femoral pulses and palpable dorsalis pedis pulses bilaterally. PULMONARY: There is good air exchange bilaterally without wheezing or rales. ABDOMEN: Soft and non-tender with normal pitched bowel sounds.  MUSCULOSKELETAL: There are no major deformities or cyanosis. NEUROLOGIC: No focal weakness or paresthesias are detected. SKIN: There are no ulcers or rashes noted. PSYCHIATRIC: The patient has a normal affect.  DATA:    CT ANGIO ABDOMEN PELVIS: I reviewed the images of her CT angiogram of the abdomen and pelvis.  She is status post open repair of a juxtarenal aneurysm.  There are no complicating features noted on CT angiogram.  An incidental finding however was a 1.2 x 1.0 cm right middle lobe nodule.  ARTERIAL DOPPLER STUDY: I have independently interpreted her arterial Doppler study today.  On the right side there is a triphasic posterior tibial and dorsalis pedis signal.  ABIs 100%.  Toe pressures 119 mmHg.  On the left side there is a triphasic dorsalis pedis and posterior  tibial signal.  ABIs 97%.  Toe pressures 106 mmHg.  Deitra Mayo [facility] [phone]

## 2020-09-21 ENCOUNTER — Institutional Professional Consult (permissible substitution): Payer: Medicare Other | Admitting: Pulmonary Disease

## 2020-09-27 DIAGNOSIS — N1831 Chronic kidney disease, stage 3a: Secondary | ICD-10-CM | POA: Diagnosis not present

## 2020-09-27 DIAGNOSIS — E119 Type 2 diabetes mellitus without complications: Secondary | ICD-10-CM | POA: Diagnosis not present

## 2020-09-27 DIAGNOSIS — I10 Essential (primary) hypertension: Secondary | ICD-10-CM | POA: Diagnosis not present

## 2020-09-28 DIAGNOSIS — I714 Abdominal aortic aneurysm, without rupture: Secondary | ICD-10-CM | POA: Diagnosis not present

## 2020-09-28 DIAGNOSIS — M25511 Pain in right shoulder: Secondary | ICD-10-CM | POA: Diagnosis not present

## 2020-09-28 DIAGNOSIS — R911 Solitary pulmonary nodule: Secondary | ICD-10-CM | POA: Diagnosis not present

## 2020-10-27 DIAGNOSIS — E1165 Type 2 diabetes mellitus with hyperglycemia: Secondary | ICD-10-CM | POA: Diagnosis not present

## 2020-10-27 DIAGNOSIS — E785 Hyperlipidemia, unspecified: Secondary | ICD-10-CM | POA: Diagnosis not present

## 2020-10-27 DIAGNOSIS — I129 Hypertensive chronic kidney disease with stage 1 through stage 4 chronic kidney disease, or unspecified chronic kidney disease: Secondary | ICD-10-CM | POA: Diagnosis not present

## 2020-10-27 DIAGNOSIS — I10 Essential (primary) hypertension: Secondary | ICD-10-CM | POA: Diagnosis not present

## 2021-01-12 ENCOUNTER — Other Ambulatory Visit: Payer: Self-pay

## 2021-01-12 ENCOUNTER — Encounter: Payer: Self-pay | Admitting: Internal Medicine

## 2021-01-12 ENCOUNTER — Ambulatory Visit (INDEPENDENT_AMBULATORY_CARE_PROVIDER_SITE_OTHER): Payer: Medicare Other | Admitting: Internal Medicine

## 2021-01-12 DIAGNOSIS — R911 Solitary pulmonary nodule: Secondary | ICD-10-CM | POA: Insufficient documentation

## 2021-01-12 DIAGNOSIS — R0609 Other forms of dyspnea: Secondary | ICD-10-CM | POA: Diagnosis not present

## 2021-01-12 NOTE — Assessment & Plan Note (Signed)
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

## 2021-01-12 NOTE — Patient Instructions (Addendum)
We will walk you today to see how your lungs are doing   We will schedule a PET at [facility]

## 2021-01-12 NOTE — Progress Notes (Signed)
Taylor Long, female    DOB: October 01, 1940,    MRN: [id number]   Brief patient profile:  67  yobf quit smoking 2018  referred to pulmonary clinic in [place]  01/12/2021 by Dr Taylor Long vascular surgery  for SPN  08/23/20       History of Present Illness  01/12/2021  Pulmonary/ 1st office eval/ Taylor Long / [facility] Long Office  Chief Complaint  Patient presents with   Consult    Lung nodule right lung from July 2022  Dyspnea:  walks [employer] fine but gets tired / house work fine but steps real slow due to arthritis > sob  Cough: none  Sleep: props up neck to sleep due to breathing x years on either side  SABA use: none   No obvious day to day or daytime variability or assoc excess/ purulent sputum or mucus plugs or hemoptysis or cp or chest tightness, subjective wheeze or overt sinus or hb symptoms.   Sleeping  without nocturnal  or early am exacerbation  of respiratory  c/o's or need for noct saba. Also denies any obvious fluctuation of symptoms with weather or environmental changes or other aggravating or alleviating factors except as outlined above   No unusual exposure hx or h/o childhood pna/ asthma or knowledge of premature birth.  Current Allergies, Complete Past Medical History, Past Surgical History, Family History, and Social History were reviewed in [employer] record.  ROS  Taylor following are not active complaints unless bolded Hoarseness, sore throat, dysphagia, dental problems, itching, sneezing,  nasal congestion or discharge of excess mucus or purulent secretions, ear ache,   fever, chills, sweats, unintended wt loss or wt gain, classically pleuritic or exertional cp,  orthopnea pnd or arm/hand swelling  or leg swelling, presyncope, palpitations, abdominal pain, anorexia, nausea, vomiting, diarrhea  or change in bowel habits or change in bladder habits, change in stools or change in urine, dysuria, hematuria,  rash, arthralgias, visual complaints, headache,  numbness, weakness or ataxia or problems with walking or coordination,  change in mood or  memory.             Past Medical History:  Diagnosis Date   CAD (coronary artery disease)    Cardiac catheterization March 2018 showed 80% proximal circumflex and otherwise nonobstructive disease, managed medically   Cardiomyopathy ([place])    LVEF 45-50% March 2018   Essential hypertension    Hyperlipidemia    Pre-diabetes     Outpatient Medications Prior to Visit  Medication Sig Dispense Refill   amLODipine-olmesartan (AZOR) 10-40 MG tablet Take 1 tablet by mouth daily.     aspirin EC 81 MG tablet Take 1 tablet (81 mg total) by mouth daily.     atorvastatin (LIPITOR) 20 MG tablet Take 1 tablet (20 mg total) by mouth daily. 90 tablet 3   carvedilol (COREG) 12.5 MG tablet Take 12.5 mg by mouth 2 (two) times daily.     ibuprofen (ADVIL,MOTRIN) 200 MG tablet Take 200 mg by mouth every 6 (six) hours as needed for moderate pain.     LORazepam (ATIVAN) 0.5 MG tablet Take 0.25-0.5 mg by mouth 2 (two) times daily as needed for anxiety.     metFORMIN (GLUCOPHAGE) 500 MG tablet Take 500 mg by mouth daily.     oxybutynin (DITROPAN) 5 MG tablet Take 5 mg by mouth 3 (three) times daily.     traZODone (DESYREL) 50 MG tablet Take 50 mg by mouth at bedtime.  Vitamin D, Ergocalciferol, (DRISDOL) 50000 units CAPS capsule Take 50,000 Units by mouth every 7 (seven) days.     cholestyramine (QUESTRAN) 4 GM/DOSE powder Take 4 g by mouth 2 (two) times daily with a meal.     hydrochlorothiazide (HYDRODIURIL) 12.5 MG tablet Take 12.5 mg by mouth daily.     losartan (COZAAR) 50 MG tablet Take 1 tablet by mouth daily.     metoprolol (LOPRESSOR) 50 MG tablet Take 1 tablet (50 mg total) by mouth 2 (two) times daily. 60 tablet 2   No facility-administered medications prior to visit.     Objective:     BP 136/88 (BP Location: Left Arm, Patient Position: Sitting)    Pulse 74    Temp 98 F (36.7 C) (Temporal)    Ht  5' 6.5" (1.689 m)    Wt 168 lb 1.3 oz (76.2 kg)    SpO2 97% Comment: ra   BMI 26.72 kg/m   SpO2: 97 % (ra)   Wt Readings from Last 3 Encounters:  01/12/21 168 lb 1.3 oz (76.2 kg)  08/30/20 172 lb (78 kg)  05/06/19 176 lb 12.8 oz (80.2 kg)    Somber slow moving amb bf     HEENT : pt wearing mask not removed for exam due to covid - 19 concerns.   NECK :  without JVD/Nodes/TM/ nl carotid upstrokes bilaterally   LUNGS: no acc muscle use,  Min barrel  contour chest wall with bilateral  slightly decreased bs s audible wheeze and  without cough on insp or exp maneuvers and min  Hyperresonant  to  percussion bilaterally     CV:  RRR  no s3 or murmur or increase in P2, and no edema   ABD:  obese soft and nontender with pos end  insp Hoover's  in Taylor supine position. No bruits or organomegaly appreciated, bowel sounds nl  MS:   Nl gait/  ext warm without deformities, calf tenderness, cyanosis - mild clubbing     SKIN: warm and dry without lesions    NEURO:  alert, approp, nl sensorium with  no motor or cerebellar deficits apparent.           Assessment  Solitary pulmonary nodule on lung CT Incidental finding on CT 08/23/2020  - New 1.2 x 1.0 cm nodule with slight surrounding spiculation in Taylor anterior aspect of Taylor right middle lobe  - mild clubbing on exam 01/12/2021  - PET ordered   PET ordered but this is most likely Taylor Long Lung ca with ? Is she operable/resectable so start with PET and if looks good for resection proceed with pfts/T surgery eval p holidays upcoming.   Discussed in detail all Taylor  indications, usual  risks and alternatives  relative to Taylor benefits with patient who agrees to proceed with w/u as outlined.      DOE (dyspnea on exertion) Quit smoking 2018  CT 08/23/2020 Combined paraseptal and centrilobular emphysema in Taylor lower lobes   - 01/12/2021   Walked on RA  x  2  lap(s) =  approx 300  ft  @ slow pace, stopped due to knee pain  with lowest 02 sats 95%    She'll need pfts as limited by knees but could likely at least tolerate a RMLobectomy if resectable for cure / no need for further w/u for now as not sure this is still resectable (see sep a/p)   Each maintenance medication was reviewed in detail including emphasizing most importantly  Taylor difference between maintenance and prns and under what circumstances Taylor prns are to be triggered using an action plan format where appropriate.  Total time for H and P, chart review, counseling,  directly observing portions of ambulatory 02 saturation study/ and generating customized AVS unique to this office visit / same day charting = 45 min                  Sandrea Hughs, MD 01/12/2021

## 2021-01-12 NOTE — Assessment & Plan Note (Addendum)
Incidental finding on CT 08/23/2020  - New 1.2 x 1.0 cm nodule with slight surrounding spiculation in the anterior aspect of the right middle lobe  - mild clubbing on exam 01/12/2021  - PET ordered  PET ordered but this is most likely [facility] Lung ca with ? Is she operable/resectable so start with PET and if looks good for resection proceed with pfts/T surgery eval p holidays upcoming.   Discussed in detail all the  indications, usual  risks and alternatives  relative to the benefits with patient who agrees to proceed with w/u as outlined.      Each maintenance medication was reviewed in detail including emphasizing most importantly the difference between maintenance and prns and under what circumstances the prns are to be triggered using an action plan format where appropriate.  Total time for H and P, chart review, counseling,  directly observing portions of ambulatory 02 saturation study/ and generating customized AVS unique to this office visit / same day charting = 45 min

## 2021-01-25 ENCOUNTER — Other Ambulatory Visit: Payer: Self-pay

## 2021-01-25 ENCOUNTER — Encounter (HOSPITAL_COMMUNITY): Payer: Self-pay

## 2021-01-25 ENCOUNTER — Encounter (HOSPITAL_COMMUNITY)
Admission: RE | Admit: 2021-01-25 | Discharge: 2021-01-25 | Disposition: A | Discharge status: Home / Self Care | Payer: Medicare Other | Source: Ambulatory Visit | Attending: Internal Medicine | Admitting: Internal Medicine

## 2021-01-25 DIAGNOSIS — R911 Solitary pulmonary nodule: Secondary | ICD-10-CM | POA: Insufficient documentation

## 2021-01-25 MED ORDER — FLUDEOXYGLUCOSE F - 18 (FDG) INJECTION
9.5000 | Freq: Once | INTRAVENOUS | Status: AC | PRN
  Administered 2021-01-25: 10:00:00 9.226 via INTRAVENOUS

## 2021-08-20 NOTE — Progress Notes (Unsigned)
Cardiology Office Note  Date: 08/21/2021   ID: Taylor Long, DOB 07/16/1940, MRN [id number]  PCP:  Taylor Pearson, FNP  Cardiologist:  Taylor Dell, MD Electrophysiologist:  None   Chief Complaint  Patient presents with   Cardiac follow-up    History of Present Illness: Taylor Long is a 81 y.o. female last seen in April 2021.  She presents overdue for follow-up.  From a cardiac perspective, reports a rare sense of palpitations, no chest pain or increasing shortness of breath.  I reviewed her medications which are outlined below.  She continues to follow with PCP, we are requesting her most recent lab work.  LDL from 2021 was 47.  She recalls that her hemoglobin A1c has been consistently under 7%.  Did have some trouble with loose stools on metformin, now on Glucotrol XL.  I personally reviewed her ECG today which shows sinus rhythm with PVC and nonspecific T wave changes.  Follow-up echocardiogram in March 2020 showed normal LVEF at 55 to 60%.  She was seen by [facility] in July of last year for follow-up of graft repair of AAA, overall stable.  Incidentally noted lung nodule was worked up further and ultimately found to have resolved with no evidence of malignancy by subsequent PET imaging.  Past Medical History:  Diagnosis Date   CAD (coronary artery disease)    Cardiac catheterization March 2018 showed 80% proximal circumflex and otherwise nonobstructive disease, managed medically   Cardiomyopathy (HCC)    LVEF 45-50% March 2018   Essential hypertension    Hyperlipidemia    Pre-diabetes     Past Surgical History:  Procedure Laterality Date   ABDOMINAL AORTIC ANEURYSM REPAIR N/A 04/12/2016   Procedure: ANEURYSM ABDOMINAL AORTIC REPAIR;  Surgeon: Taylor Hint, MD;  Location: [facility] OR;  Service: Vascular;  Laterality: N/A;   LEFT HEART CATH AND CORONARY ANGIOGRAPHY N/A 04/09/2016   Procedure: Left Heart Cath and Coronary Angiography;  Surgeon: Taylor Lex, MD;   Location: [facility] INVASIVE CV LAB;  Service: Cardiovascular;  Laterality: N/A;    Current Outpatient Medications  Medication Sig Dispense Refill   amLODipine-olmesartan (AZOR) 10-40 MG tablet Take 1 tablet by mouth daily.     aspirin EC 81 MG tablet Take 1 tablet (81 mg total) by mouth daily.     atorvastatin (LIPITOR) 20 MG tablet Take 1 tablet (20 mg total) by mouth daily. 90 tablet 3   carvedilol (COREG) 12.5 MG tablet Take 12.5 mg by mouth 2 (two) times daily.     cholestyramine (QUESTRAN) 4 GM/DOSE powder SMARTSIG:1 scoopful By Mouth Twice Daily PRN     glipiZIDE (GLUCOTROL XL) 5 MG 24 hr tablet Take 5 mg by mouth daily.     ibuprofen (ADVIL,MOTRIN) 200 MG tablet Take 200 mg by mouth every 6 (six) hours as needed for moderate pain.     LORazepam (ATIVAN) 0.5 MG tablet Take 0.25-0.5 mg by mouth 2 (two) times daily as needed for anxiety.     Vitamin D, Ergocalciferol, (DRISDOL) 50000 units CAPS capsule Take 50,000 Units by mouth every 7 (seven) days.     No current facility-administered medications for this visit.   Allergies:  Crestor [rosuvastatin calcium] and Latex   ROS:  No palpitations or syncope.  Physical Exam: VS:  BP 112/70   Pulse 78   Ht 5\' 7"  (1.702 m)   Wt 174 lb 9.6 oz (79.2 kg)   SpO2 95%   BMI 27.35 kg/m , BMI Body  mass index is 27.35 kg/m.  Wt Readings from Last 3 Encounters:  08/21/21 174 lb 9.6 oz (79.2 kg)  01/12/21 168 lb 1.3 oz (76.2 kg)  08/30/20 172 lb (78 kg)    General: Patient appears comfortable at rest. HEENT: Conjunctiva and lids normal, oropharynx clear. Neck: Supple, no elevated JVP or carotid bruits, no thyromegaly. Lungs: Clear to auscultation, nonlabored breathing at rest. Cardiac: Regular rate and rhythm, no S3 or significant systolic murmur, no pericardial rub. Extremities: No pitting edema.  ECG:  An ECG dated 05/06/2019 was personally reviewed today and demonstrated:  Sinus rhythm with nonspecific T wave changes.  Recent  Labwork:  March 2021: Hemoglobin 13.5, platelets 258, BUN 27, creatinine 1.04, potassium 3.9, AST 18, ALT 14, cholesterol 115, triglycerides 125, HDL 46, LDL 47, hemoglobin A1c 6.6%  Other Studies Reviewed Today:  Echocardiogram 04/07/2018:  1. The left ventricle has normal systolic function, with an ejection fraction of 55-60%. The cavity size was normal. There is mild basal septal hypertrophy. Left ventricular diastolic Doppler parameters are consistent with impaired relaxation.  Indeterminate filling pressures No evidence of left ventricular regional wall motion abnormalities.  2. The right ventricle has normal systolic function. The cavity was normal. There is no increase in right ventricular wall thickness.  3. The tricuspid valve is grossly normal.  4. The aortic valve is tricuspid.  5. The pulmonic valve was grossly normal. Pulmonic valve regurgitation is mild by color flow Doppler.  6. The aortic root is normal in size and structure.  ABIs 08/30/2020: Summary:  Right: Resting right ankle-brachial index is within normal range. No  evidence of significant right lower extremity arterial disease. The right  toe-brachial index is normal.   Left: Resting left ankle-brachial index is within normal range. No  evidence of significant left lower extremity arterial disease. The left  toe-brachial index is normal.   Assessment and Plan:  1.  CAD with heavily calcified proximal circumflex disease that has been managed medically.  No progressive angina symptoms.  ECG reviewed and stable.  She is currently on aspirin, Azor, Lipitor, and Coreg.  2.  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  3.  Mixed hyperlipidemia, remains on Lipitor.  Requesting interval lab work from PCP for review.  Medication Adjustments/Labs and Tests Ordered: Current medicines are reviewed at length with the patient today.  Concerns regarding medicines are outlined above.   Tests  Ordered: Orders Placed This Encounter  Procedures   EKG 12-Lead    Medication Changes: No orders of the defined types were placed in this encounter.   Disposition:  Follow up  1 year.  Signed, Taylor Sidle, MD, [facility] 08/21/2021 10:18 AM    [facility] at [facility] [address] [place], [place] [address] Phone: [phone]; Fax: [phone]

## 2021-08-21 ENCOUNTER — Ambulatory Visit (INDEPENDENT_AMBULATORY_CARE_PROVIDER_SITE_OTHER): Payer: Medicare Other | Admitting: Cardiology

## 2021-08-21 ENCOUNTER — Encounter: Payer: Self-pay | Admitting: Cardiology

## 2021-08-21 VITALS — BP 112/70 | HR 78 | Ht 67.0 in | Wt 174.6 lb

## 2021-08-21 DIAGNOSIS — I25119 Atherosclerotic heart disease of native coronary artery with unspecified angina pectoris: Secondary | ICD-10-CM | POA: Diagnosis not present

## 2021-08-21 DIAGNOSIS — Z8679 Personal history of other diseases of the circulatory system: Secondary | ICD-10-CM | POA: Diagnosis not present

## 2021-08-21 DIAGNOSIS — E782 Mixed hyperlipidemia: Secondary | ICD-10-CM | POA: Diagnosis not present

## 2021-08-21 NOTE — Patient Instructions (Signed)
Medication Instructions:  Your physician recommends that you continue on your current medications as directed. Please refer to the Current Medication list given to you today.   Labwork: None today  Testing/Procedures: None today  Follow-Up: 1 year  Any Other Special Instructions Will Be Listed Below (If Applicable).  If you need a refill on your cardiac medications before your next appointment, please call your pharmacy.  

## 2022-09-05 ENCOUNTER — Ambulatory Visit: Payer: 59 | Attending: Cardiology | Admitting: Cardiology

## 2022-09-05 ENCOUNTER — Encounter: Payer: Self-pay | Admitting: Cardiology

## 2022-09-05 VITALS — BP 142/82 | HR 81 | Wt 186.0 lb

## 2022-09-05 DIAGNOSIS — I25119 Atherosclerotic heart disease of native coronary artery with unspecified angina pectoris: Secondary | ICD-10-CM | POA: Diagnosis not present

## 2022-09-05 DIAGNOSIS — Z8679 Personal history of other diseases of the circulatory system: Secondary | ICD-10-CM | POA: Diagnosis not present

## 2022-09-05 DIAGNOSIS — E782 Mixed hyperlipidemia: Secondary | ICD-10-CM | POA: Diagnosis not present

## 2022-09-05 NOTE — Patient Instructions (Addendum)
Medication Instructions:  Your physician recommends that you continue on your current medications as directed. Please refer to the Current Medication list given to you today.   Labwork: None today  Testing/Procedures: None today  Follow-Up: 1 year  Any Other Special Instructions Will Be Listed Below (If Applicable).  If you need a refill on your cardiac medications before your next appointment, please call your pharmacy.  

## 2022-09-05 NOTE — Progress Notes (Signed)
    Cardiology Office Note  Date: 09/05/2022   ID: TANDREA MANDELLA, DOB 1940-12-09, MRN [id number]  History of Present Illness: Taylor Long is a 82 y.o. female last seen in July 2023.  She is here for a routine visit.  She does not report any obvious angina with typical activities.  Overall NYHA class II dyspnea with basic ADLs.  She is not driving and does rely on others to get around which has been somewhat of a frustration.  She continues to follow regularly with PCP, I reviewed her lab work from July.  We went over her medications today.  Cardiac regimen is stable and she reports no obvious intolerances.  ECG today shows normal sinus rhythm with low voltage and nonspecific T wave changes.  Physical Exam: VS:  BP (!) 142/82   Pulse 81   Wt 186 lb (84.4 kg)   SpO2 94%   BMI 29.13 kg/m , BMI Body mass index is 29.13 kg/m.  Wt Readings from Last 3 Encounters:  09/05/22 186 lb (84.4 kg)  08/21/21 174 lb 9.6 oz (79.2 kg)  01/12/21 168 lb 1.3 oz (76.2 kg)    General: Patient appears comfortable at rest. HEENT: Conjunctiva and lids normal. Neck: Supple, no elevated JVP or carotid bruits. Lungs: Clear to auscultation, nonlabored breathing at rest. Cardiac: Regular rate and rhythm, no S3 or significant systolic murmur. Extremities: No pitting edema.  ECG:  An ECG dated 08/21/2021 was personally reviewed today and demonstrated:  Sinus rhythm with PVC and nonspecific T wave changes.  Labwork:  July 2024: Hemoglobin 12.9, platelets 235, BUN 29, creatinine 1.22, potassium 5, AST 19, ALT 14, cholesterol 138, triglycerides 137, HDL 57, LDL 57, hemoglobin A1c 6.3%  Other Studies Reviewed Today:  No interval cardiac testing for review today.  Assessment and Plan:  1.  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  No accelerating angina, ECG reviewed and stable.  Plan to continue medical therapy and  observation.  She is on aspirin, Azor, Coreg, and Lipitor.  2.  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   3.  Mixed hyperlipidemia, remains on Lipitor.  LDL 57 in July.  Disposition:  Follow up  1 year, sooner if needed.  Signed, Taylor Long, M.D., F.A.C.C.  HeartCare at [facility]

## 2023-10-29 ENCOUNTER — Ambulatory Visit: Attending: Cardiology | Admitting: Cardiology

## 2023-10-29 ENCOUNTER — Encounter: Payer: Self-pay | Admitting: Cardiology

## 2023-10-29 VITALS — BP 124/68 | HR 72 | Ht 66.0 in | Wt 178.4 lb

## 2023-10-29 DIAGNOSIS — E782 Mixed hyperlipidemia: Secondary | ICD-10-CM

## 2023-10-29 DIAGNOSIS — I25119 Atherosclerotic heart disease of native coronary artery with unspecified angina pectoris: Secondary | ICD-10-CM | POA: Diagnosis not present

## 2023-10-29 DIAGNOSIS — I5020 Unspecified systolic (congestive) heart failure: Secondary | ICD-10-CM

## 2023-10-29 DIAGNOSIS — N1832 Chronic kidney disease, stage 3b: Secondary | ICD-10-CM | POA: Diagnosis not present

## 2023-10-29 MED ORDER — ISOSORBIDE MONONITRATE ER 30 MG PO TB24: 15.0000 mg | ORAL_TABLET | Freq: Every day | ORAL | 3 refills | Status: DC

## 2023-10-29 NOTE — Progress Notes (Signed)
    Cardiology Office Note  Date: 10/29/2023   ID: Taylor Long, DOB [birth year], MRN [id number]  History of Present Illness: Taylor Long is an 83 y.o. female last seen in August 2024.  She is here for a follow-up visit.  She tells me that over the last several months she has been experiencing worsening dyspnea on exertion as well as exertional chest tightness consistent with angina.  Symptoms resolve with rest for several minutes.  Reports an occasional sense of palpitations, but not necessarily with her exertional symptoms.  She has had no syncope.  She saw her PCP and was encouraged to follow-up with us .  We went over her medications, she reports compliance with current regimen.  Blood pressure is normal today.  She had lab work in June at which point her LDL was 61.  I reviewed her ECG today which shows sinus rhythm with low voltage and nonspecific T wave changes.  Today we discussed her symptoms and likelihood that she has had progressive CAD, need to reassess LVEF as well given prior evidence of cardiomyopathy.  Physical Exam: VS:  BP 124/68 (BP Location: Left Arm, Cuff Size: Normal)   Pulse 72   Ht 5' 6 (1.676 m)   Wt 178 lb 6.4 oz (80.9 kg)   SpO2 97%   BMI 28.79 kg/m , BMI Body mass index is 28.79 kg/m.  Wt Readings from Last 3 Encounters:  10/29/23 178 lb 6.4 oz (80.9 kg)  09/05/22 186 lb (84.4 kg)  08/21/21 174 lb 9.6 oz (79.2 kg)    General: Patient appears comfortable at rest. HEENT: Conjunctiva and lids normal. Neck: Supple, no elevated JVP or carotid bruits. Lungs: Clear to auscultation, nonlabored breathing at rest. Cardiac: Regular rate and rhythm, no S3, 2/6 systolic murmur. Extremities: No pitting edema.  ECG:  An ECG dated 09/05/2022 was personally reviewed today and demonstrated:  Sinus rhythm with low voltage and nonspecific T wave changes.  Labwork:  June 2025: BUN 23, creatinine 1.3, GFR 42, potassium 4.6, AST 16, ALT 12, hemoglobin 13.4, platelets  243, cholesterol 134, triglycerides 115, HDL 52, LDL 61, hemoglobin A1c 6.1%  Other Studies Reviewed Today:  No interval cardiac testing for review today.  Assessment and Plan:  1.  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  She reports worsening dyspnea on exertion and exertional chest tightness consistent with angina, present over the last several months.  Plan to initiate Imdur 15 mg daily and proceed with both echocardiogram and a Lexiscan Myoview to reevaluate cardiac structural and ischemic status.  Continue aspirin  81 mg daily and Lipitor  20 mg daily.   2.  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  Plan to follow-up echocardiogram as discussed above.   3.  Mixed hyperlipidemia.  LDL 61 and HDL 52 in June.  Continue Lipitor  20 mg daily.  4.  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  Disposition:  Follow up test results.  Signed, Jayson JUDITHANN Sierras, M.D., F.A.C.C. [facility] at [facility]

## 2023-10-29 NOTE — Patient Instructions (Signed)
 Medication Instructions:  START Imdur 15 mg daily at bedtime  Labwork: None today  Testing/Procedures: Your physician has requested that you have a lexiscan myoview. For further information please visit [email or web address] Please follow instruction sheet, as given.  Follow-Up: To be determined  Any Other Special Instructions Will Be Listed Below (If Applicable).  If you need a refill on your cardiac medications before your next appointment, please call your pharmacy.

## 2023-11-19 ENCOUNTER — Encounter (HOSPITAL_COMMUNITY)
Admission: RE | Admit: 2023-11-19 | Discharge: 2023-11-19 | Disposition: A | Discharge status: Home / Self Care | Source: Ambulatory Visit | Attending: Cardiology | Admitting: Cardiology

## 2023-11-19 ENCOUNTER — Other Ambulatory Visit: Payer: Self-pay | Admitting: Physician Assistant

## 2023-11-19 ENCOUNTER — Ambulatory Visit (HOSPITAL_COMMUNITY)
Admission: RE | Admit: 2023-11-19 | Discharge: 2023-11-19 | Disposition: A | Discharge status: Home / Self Care | Source: Ambulatory Visit | Attending: Cardiology | Admitting: Cardiology

## 2023-11-19 ENCOUNTER — Other Ambulatory Visit: Payer: Self-pay

## 2023-11-19 ENCOUNTER — Ambulatory Visit: Payer: Self-pay | Admitting: Cardiology

## 2023-11-19 DIAGNOSIS — R0609 Other forms of dyspnea: Secondary | ICD-10-CM

## 2023-11-19 DIAGNOSIS — I25119 Atherosclerotic heart disease of native coronary artery with unspecified angina pectoris: Secondary | ICD-10-CM | POA: Insufficient documentation

## 2023-11-19 DIAGNOSIS — I5020 Unspecified systolic (congestive) heart failure: Secondary | ICD-10-CM

## 2023-11-19 LAB — NM MYOCAR MULTI W/SPECT W/WALL MOTION / EF
Base ST Depression (mm): 0 mm
LV dias vol: 59 mL (ref 46–106)
LV sys vol: 19 mL (ref 3.8–5.2)
MPHR: 140 {beats}/min
Nuc Stress EF: 68 %
Peak HR: 94 {beats}/min
Percent HR: 67 %
RATE: 0.3
Rest HR: 67 {beats}/min
Rest Nuclear Isotope Dose: 9.1 mCi
SDS: 3
SRS: 0
SSS: 3
ST Depression (mm): 0 mm
Stress Nuclear Isotope Dose: 30.5 mCi
TID: 1.28

## 2023-11-19 MED ORDER — TECHNETIUM TC 99M TETROFOSMIN IV KIT
30.0000 | PACK | Freq: Once | INTRAVENOUS | Status: AC | PRN
  Administered 2023-11-19: 30.5 via INTRAVENOUS

## 2023-11-19 MED ORDER — TECHNETIUM TC 99M TETROFOSMIN IV KIT
10.0000 | PACK | Freq: Once | INTRAVENOUS | Status: AC | PRN
  Administered 2023-11-19: 9.1 via INTRAVENOUS

## 2023-11-19 MED ORDER — REGADENOSON 0.4 MG/5ML IV SOLN
INTRAVENOUS | Status: AC
  Administered 2023-11-19: 0.4 mg via INTRAVENOUS
  Filled 2023-11-19: qty 5

## 2023-11-19 MED ORDER — SODIUM CHLORIDE FLUSH 0.9 % IV SOLN
INTRAVENOUS | Status: AC
  Filled 2023-11-19: qty 10

## 2023-11-19 NOTE — Progress Notes (Signed)
     Taylor Long presented for a Lexiscan nuclear stress test today.  I Lorette CINDERELLA Kapur, PA-C, provided direct supervision and was present during the stress portion of the study today, which was completed without significant symptoms, immediate complications, or acute ST/T changes on ECG.  Stress imaging is pending at this time.  Preliminary ECG findings may be listed in the chart, but the stress test result will not be finalized until perfusion imaging is complete.  Lorette CINDERELLA Kapur, PA-C  11/19/2023, 10:01 AM

## 2023-12-02 NOTE — Telephone Encounter (Signed)
 Patient returned staff call regarding results.

## 2023-12-02 NOTE — Telephone Encounter (Signed)
 Pt states she never received a call or spoke with anyone about results. She is requesting a callback at number listed as Home: [phone]. Please advise.

## 2023-12-17 ENCOUNTER — Ambulatory Visit (HOSPITAL_COMMUNITY)
Admission: RE | Admit: 2023-12-17 | Discharge: 2023-12-17 | Disposition: A | Discharge status: Home / Self Care | Source: Ambulatory Visit | Attending: Cardiology | Admitting: Cardiology

## 2023-12-17 ENCOUNTER — Ambulatory Visit: Payer: Self-pay | Admitting: Cardiology

## 2023-12-17 ENCOUNTER — Telehealth: Payer: Self-pay | Admitting: Cardiology

## 2023-12-17 DIAGNOSIS — I25119 Atherosclerotic heart disease of native coronary artery with unspecified angina pectoris: Secondary | ICD-10-CM | POA: Diagnosis present

## 2023-12-17 DIAGNOSIS — R0609 Other forms of dyspnea: Secondary | ICD-10-CM | POA: Insufficient documentation

## 2023-12-17 DIAGNOSIS — I5020 Unspecified systolic (congestive) heart failure: Secondary | ICD-10-CM | POA: Insufficient documentation

## 2023-12-17 LAB — ECHOCARDIOGRAM COMPLETE
AR max vel: 2.62 cm2
AV Area VTI: 2.92 cm2
AV Area mean vel: 2.5 cm2
AV Mean grad: 2 mmHg
AV Peak grad: 3.1 mmHg
Ao pk vel: 0.88 m/s
Area-P 1/2: 3.74 cm2
S' Lateral: 2.8 cm

## 2023-12-17 NOTE — Telephone Encounter (Signed)
 The patient has been notified of the result and verbalized understanding.  All questions (if any) were answered. DOMINIC COLUMBUS, LPN [id number]/[phone]

## 2023-12-17 NOTE — Telephone Encounter (Signed)
 Pt is returning nurse call and is requesting a callback. Please advise

## 2023-12-17 NOTE — Telephone Encounter (Signed)
 Taylor Jayson MATSU, MD to Me    12/17/23  3:04 PM Result Note Results reviewed.  Follow-up echocardiogram shows normal LVEF at 60 to 65% with mild diastolic dysfunction, no significant valvular abnormalities.    Attempt to reach, line just rings.

## 2024-01-07 ENCOUNTER — Ambulatory Visit: Attending: Cardiology | Admitting: Cardiology

## 2024-01-07 ENCOUNTER — Encounter: Payer: Self-pay | Admitting: Cardiology

## 2024-01-07 VITALS — BP 128/72 | HR 79 | Ht 66.0 in | Wt 178.6 lb

## 2024-01-07 DIAGNOSIS — I5020 Unspecified systolic (congestive) heart failure: Secondary | ICD-10-CM

## 2024-01-07 DIAGNOSIS — I25119 Atherosclerotic heart disease of native coronary artery with unspecified angina pectoris: Secondary | ICD-10-CM | POA: Diagnosis not present

## 2024-01-07 DIAGNOSIS — N1832 Chronic kidney disease, stage 3b: Secondary | ICD-10-CM | POA: Diagnosis not present

## 2024-01-07 DIAGNOSIS — E782 Mixed hyperlipidemia: Secondary | ICD-10-CM

## 2024-01-07 MED ORDER — CARVEDILOL 6.25 MG PO TABS: 6.2500 mg | ORAL_TABLET | Freq: Two times a day (BID) | ORAL | 1 refills | Status: AC

## 2024-01-07 NOTE — Patient Instructions (Addendum)
 Medication Instructions:  Your physician has recommended you make the following change in your medication:  Stop isosorbide  mononitrate. Decrease carvedilol to 6.25 mg twice daily. Continue all other medications as prescribed.  Labwork: none  Testing/Procedures: none  Follow-Up: Your physician recommends that you schedule a follow-up appointment in: 6 months in [place]  Any Other Special Instructions Will Be Listed Below (If Applicable).  If you need a refill on your cardiac medications before your next appointment, please call your pharmacy.

## 2024-01-07 NOTE — Progress Notes (Signed)
 Cardiology Office Note  Date: 01/07/2024   ID: Taylor, Long 02-24-1940, MRN [id number]  History of Present Illness: Taylor Long is an 83 y.o. female last seen in October.  She is here for a follow-up visit.  We discussed interval cardiac structural and ischemic testing as detailed below.  She has been experiencing dyspnea on exertion, we did suspect progressive ischemic heart disease, however her Myoview  was low risk with no frank ischemia and her echocardiogram shows that LVEF remains normal with grade 1 diastolic dysfunction.  Aortic valve mildly calcified, but no stenosis.  She did not report any improvement in symptoms following trial of low-dose Imdur .  I went over her medications today.  She has been taking Coreg 12.5 mg daily, we discussed modifying the dose.  Also plan to stop Imdur  at this point.  Her blood pressure is well-controlled today on the remainder of her medications, last LDL was 61.  Physical Exam: VS:  BP 128/72 (BP Location: Left Arm)   Pulse 79   Ht 5' 6 (1.676 m)   Wt 178 lb 9.6 oz (81 kg)   SpO2 96%   BMI 28.83 kg/m , BMI Body mass index is 28.83 kg/m.  Wt Readings from Last 3 Encounters:  01/07/24 178 lb 9.6 oz (81 kg)  10/29/23 178 lb 6.4 oz (80.9 kg)  09/05/22 186 lb (84.4 kg)    General: Patient appears comfortable at rest. HEENT: Conjunctiva and lids normal. Neck: Supple, no elevated JVP or carotid bruits. Lungs: Clear to auscultation, nonlabored breathing at rest. Cardiac: Regular rate and rhythm, no S3, 2/6 systolic murmur, no pericardial rub. Extremities: No pitting edema.  ECG:  An ECG dated 10/29/2023 was personally reviewed today and demonstrated:  Sinus rhythm with low voltage and nonspecific T wave changes.  Labwork:  June 2025: BUN 23, creatinine 1.3, GFR 42, potassium 4.6, AST 16, ALT 12, hemoglobin 13.4, platelets 243, cholesterol 134, triglycerides 115, HDL 52, LDL 61, hemoglobin A1c 6.1%   Other Studies Reviewed  Today:  Lexiscan  Myoview  11/19/2023:   No ST deviation was noted. Pharmacological protocol is used.   LV perfusion is normal. There is no evidence of ischemia. There is no evidence of infarction.   Left ventricular function is normal. Nuclear stress EF: 68%.   Findings are consistent with no ischemia and no infarction. The study is low risk.  Echocardiogram 12/17/2023:  1. Left ventricular ejection fraction, by estimation, is 60 to 65%. The  left ventricle has normal function. The left ventricle has no regional  wall motion abnormalities. There is mild left ventricular hypertrophy.  Left ventricular diastolic parameters  are consistent with Grade I diastolic dysfunction (impaired relaxation).   2. Right ventricular systolic function is normal. The right ventricular  size is normal.   3. The mitral valve is normal in structure. No evidence of mitral valve  regurgitation. No evidence of mitral stenosis.   4. The aortic valve is tricuspid. There is mild calcification of the  aortic valve. There is mild thickening of the aortic valve. Aortic valve  regurgitation is not visualized. No aortic stenosis is present.   5. The inferior vena cava is normal in size with greater than 50%  respiratory variability, suggesting right atrial pressure of 3 mmHg.   Assessment and Plan:  1.  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  Follow-up Lexiscan  Myoview  in October showed no evidence of focal ischemia, low  risk.  She does not report frank angina but does have dyspnea on exertion.  At this point plan will be to continue medical therapy.  If symptoms escalate, she could consider a right and left heart catheterization, we discussed this but she prefers to hold off on further invasive testing.  Continue aspirin  81 mg daily and Lipitor  20 mg daily.   2.  HFrecEF, LVEF normal at 60 to 65% by echocardiogram in November.  Change  Coreg to 6.25 mg twice daily and continue Azor 10/40 mg daily.   3.  Mixed hyperlipidemia.  LDL 61 and HDL 52 in June.  Continue Lipitor  20 mg daily.   4.  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  Disposition:  Follow up 6 months.  Signed, Jayson JUDITHANN Sierras, M.D., F.A.C.C. [facility] at [facility]

## 2024-01-19 ENCOUNTER — Telehealth: Payer: Self-pay | Admitting: Cardiology

## 2024-01-19 NOTE — Telephone Encounter (Signed)
 Spoke with patient earlier this evening - c/o SOB - she thinks it's the same as it was at last OV but may be noticing more with activity.  No weight gain, no chest pain or cough.  BP running 120-140's / 75-79 and HR running in the 70's.  Will be seeing her pcp on 02/07/2023.

## 2024-01-19 NOTE — Telephone Encounter (Signed)
 Pt c/o Shortness Of Breath: STAT if SOB developed within the last 24 hours or pt is noticeably SOB on the phone  Pt called in asking if any of the tests she has had showed anything on her lungs. Informed I would ask the provider and the nurse will get back with you. I asked if she was having symptoms or if she was just curious. She stated she has still been a little SOB.   1. Are you currently SOB (can you hear that pt is SOB on the phone)? No   2. How long have you been experiencing SOB? Few weeks   3. Are you SOB when sitting or when up moving around? Moving around   4. Are you currently experiencing any other symptoms? No

## 2024-01-20 NOTE — Telephone Encounter (Signed)
 Left message to return call

## 2024-01-21 NOTE — Telephone Encounter (Signed)
 Pt requesting a c/b from the nurse before scheduling a appt.

## 2024-01-23 NOTE — Telephone Encounter (Signed)
 Spoke to patient who stated that she does not want to be seen in office at this time. Pt stated she will wait until its time for her follow up to discuss with provider.
